# Patient Record
Sex: Female | Born: 1980 | Race: White | Hispanic: No | Marital: Married | Smoking: Never smoker
Health system: Southern US, Community
[De-identification: ages and names within clinical notes are randomized; demographics above are authoritative.]

## PROBLEM LIST (undated history)

## (undated) ENCOUNTER — Inpatient Hospital Stay (HOSPITAL_COMMUNITY): Payer: Self-pay

## (undated) DIAGNOSIS — F909 Attention-deficit hyperactivity disorder, unspecified type: Secondary | ICD-10-CM

## (undated) DIAGNOSIS — D649 Anemia, unspecified: Secondary | ICD-10-CM

## (undated) DIAGNOSIS — F3181 Bipolar II disorder: Secondary | ICD-10-CM

## (undated) HISTORY — PX: BREAST SURGERY: SHX581

## (undated) HISTORY — DX: Attention-deficit hyperactivity disorder, unspecified type: F90.9

## (undated) HISTORY — PX: APPENDECTOMY: SHX54

## (undated) HISTORY — DX: Bipolar II disorder: F31.81

## (undated) HISTORY — PX: WISDOM TOOTH EXTRACTION: SHX21

---

## 2016-06-17 ENCOUNTER — Telehealth: Payer: Self-pay | Admitting: Emergency Medicine

## 2016-06-17 NOTE — Telephone Encounter (Signed)
Called pt to complete pre-visit phone call. LMOVM to return call. Patient advised on message that if unable to return call to please arrive 10-15 minutes early for appointment to update all insurance and paperwork with front desk.

## 2016-06-18 ENCOUNTER — Ambulatory Visit (INDEPENDENT_AMBULATORY_CARE_PROVIDER_SITE_OTHER): Payer: BLUE CROSS/BLUE SHIELD | Admitting: Family Medicine

## 2016-06-18 ENCOUNTER — Encounter: Payer: Self-pay | Admitting: Family Medicine

## 2016-06-18 VITALS — BP 120/70 | HR 88 | Temp 97.7°F | Resp 16 | Ht 65.0 in | Wt 212.0 lb

## 2016-06-18 DIAGNOSIS — F3181 Bipolar II disorder: Secondary | ICD-10-CM | POA: Insufficient documentation

## 2016-06-18 DIAGNOSIS — F988 Other specified behavioral and emotional disorders with onset usually occurring in childhood and adolescence: Secondary | ICD-10-CM | POA: Insufficient documentation

## 2016-06-18 DIAGNOSIS — F909 Attention-deficit hyperactivity disorder, unspecified type: Secondary | ICD-10-CM | POA: Diagnosis not present

## 2016-06-18 DIAGNOSIS — Z01419 Encounter for gynecological examination (general) (routine) without abnormal findings: Secondary | ICD-10-CM

## 2016-06-18 DIAGNOSIS — E669 Obesity, unspecified: Secondary | ICD-10-CM | POA: Insufficient documentation

## 2016-06-18 DIAGNOSIS — Z23 Encounter for immunization: Secondary | ICD-10-CM | POA: Diagnosis not present

## 2016-06-18 MED ORDER — AMPHETAMINE-DEXTROAMPHET ER 10 MG PO CP24: ORAL_CAPSULE | ORAL | 0 refills | Status: DC

## 2016-06-18 NOTE — Progress Notes (Signed)
Pre visit review using our clinic review tool, if applicable. No additional management support is needed unless otherwise documented below in the visit note. 

## 2016-06-18 NOTE — Progress Notes (Signed)
   Subjective:    Patient ID: Andrea [name], female    DOB: 1981-02-06, 35 y.o.   MRN: [id number]  HPI New to establish.  Previous MD- Vidette Blas, [facility]  Needs local GYN  ADHD- chronic problem, pt has been on Adderall w/ good control of sxs.  Taking 10mg  tabs BID.  Hypomanic- pt has not had an episode in over 3 yrs.  Previously on Seroquel, Lamictal (had allergic rxn), Lithium.  Will take Ativan as needed for panicked moments.  Taking Ambien as needed for sleep- ~4x/week.  Previously worked with [facility] so she now sees Dr Gershon Mussel Sprague as needed for consults.  Obesity- pt has a nearly 34 yr old at home, gained 80 lbs w/ pregnancy.  Has so far lost 25 lbs.  Exercising twice weekly- trying to increase this.  Review of Systems For ROS see HPI     Objective:   Physical Exam  Constitutional: She is oriented to person, place, and time. She appears well-developed and well-nourished. No distress.  HENT:  Head: Normocephalic and atraumatic.  Eyes: Conjunctivae and EOM are normal. Pupils are equal, round, and reactive to light.  Neck: Normal range of motion. Neck supple. No thyromegaly present.  Cardiovascular: Normal rate, regular rhythm, normal heart sounds and intact distal pulses.   No murmur heard. Pulmonary/Chest: Effort normal and breath sounds normal. No respiratory distress.  Abdominal: Soft. She exhibits no distension. There is no tenderness.  Musculoskeletal: She exhibits no edema.  Lymphadenopathy:    She has no cervical adenopathy.  Neurological: She is alert and oriented to person, place, and time.  Skin: Skin is warm and dry.  Psychiatric: She has a normal mood and affect. Her behavior is normal.  Vitals reviewed.         Assessment & Plan:

## 2016-06-18 NOTE — Assessment & Plan Note (Signed)
New to provider, ongoing for pt.  Currently well controlled.  Has psych that she consults PRN.  Taking Ambien to help regulate sleep and Ativan prn.  Will continue to follow.

## 2016-06-18 NOTE — Patient Instructions (Signed)
Schedule your complete physical in 6 months We'll notify you of your lab results and make any changes if needed Keep up the good work on healthy diet and regular exercise- you can do it!!! Call with any questions or concerns Welcome!!  We're glad to have you!!! 

## 2016-06-18 NOTE — Assessment & Plan Note (Signed)
New to provider.  Pt has gained 80 lbs in last 2 yrs due to pregnancy.  Exercising twice weekly but is hoping to increase this.  Check labs to risk stratify.  Stressed need for healthy diet and regular exercise.  Will follow.

## 2016-06-18 NOTE — Assessment & Plan Note (Signed)
New to provider, ongoing for pt.  She has been well controlled on her Adderall XR 10mg  twice daily but insurance is giving her a hard time about this- recommending she take 20mg  once daily.  Pt's psych and previous MD had serious reservations about this given her hx of hypomania.  Prescription given and will complete any prior authorization that is required.  Will follow.

## 2016-06-25 ENCOUNTER — Other Ambulatory Visit: Payer: BLUE CROSS/BLUE SHIELD

## 2016-06-25 LAB — BASIC METABOLIC PANEL
BUN: 11 mg/dL (ref 6–23)
CALCIUM: 9 mg/dL (ref 8.4–10.5)
CO2: 24 mEq/L (ref 19–32)
Chloride: 104 mEq/L (ref 96–112)
Creatinine, Ser: 0.66 mg/dL (ref 0.40–1.20)
GFR: 108.73 mL/min (ref >60.00)
Glucose, Bld: 79 mg/dL (ref 70–99)
Potassium: 4.3 mEq/L (ref 3.5–5.1)
SODIUM: 136 meq/L (ref 135–145)

## 2016-06-25 LAB — HEPATIC FUNCTION PANEL
ALT: 20 U/L (ref 0–35)
AST: 14 U/L (ref 0–37)
Albumin: 3.9 g/dL (ref 3.5–5.2)
Alkaline Phosphatase: 60 U/L (ref 39–117)
BILIRUBIN DIRECT: 0.1 mg/dL (ref 0.0–0.3)
BILIRUBIN TOTAL: 0.4 mg/dL (ref 0.2–1.2)
TOTAL PROTEIN: 7 g/dL (ref 6.0–8.3)

## 2016-06-25 LAB — CBC WITH DIFFERENTIAL/PLATELET
BASOS PCT: 2.1 % (ref 0.0–3.0)
Basophils Absolute: 0.1 10*3/uL (ref 0.0–0.1)
EOS PCT: 1.3 % (ref 0.0–5.0)
Eosinophils Absolute: 0.1 10*3/uL (ref 0.0–0.7)
HEMATOCRIT: 36 % (ref 36.0–46.0)
HEMOGLOBIN: 12.2 g/dL (ref 12.0–15.0)
LYMPHS PCT: 50.1 % — AB (ref 12.0–46.0)
Lymphs Abs: 2.9 10*3/uL (ref 0.7–4.0)
MCHC: 33.8 g/dL (ref 30.0–36.0)
MCV: 90.5 fl (ref 78.0–100.0)
MONOS PCT: 4.1 % (ref 3.0–12.0)
Monocytes Absolute: 0.2 10*3/uL (ref 0.1–1.0)
NEUTROS ABS: 2.5 10*3/uL (ref 1.4–7.7)
Neutrophils Relative %: 42.4 % — ABNORMAL LOW (ref 43.0–77.0)
PLATELETS: 284 10*3/uL (ref 150.0–400.0)
RBC: 3.98 Mil/uL (ref 3.87–5.11)
RDW: 12.6 % (ref 11.5–15.5)
WBC: 5.8 10*3/uL (ref 4.0–10.5)

## 2016-06-25 LAB — LIPID PANEL
CHOL/HDL RATIO: 3
Cholesterol: 159 mg/dL (ref 0–200)
HDL: 57.7 mg/dL (ref >39.00)
LDL CALC: 70 mg/dL (ref 0–99)
NONHDL: 101.65
Triglycerides: 157 mg/dL — ABNORMAL HIGH (ref 0.0–149.0)
VLDL: 31.4 mg/dL (ref 0.0–40.0)

## 2016-06-25 LAB — TSH: TSH: 2.89 u[IU]/mL (ref 0.35–4.50)

## 2016-06-26 ENCOUNTER — Encounter: Payer: Self-pay | Admitting: General Practice

## 2016-09-18 ENCOUNTER — Telehealth: Payer: Self-pay | Admitting: *Deleted

## 2016-09-18 MED ORDER — AMPHETAMINE-DEXTROAMPHET ER 10 MG PO CP24: ORAL_CAPSULE | ORAL | 0 refills | Status: DC

## 2016-09-18 NOTE — Addendum Note (Signed)
Addended by: Davis Gourd on: 09/18/2016 02:56 PM   Modules accepted: Orders

## 2016-09-18 NOTE — Telephone Encounter (Signed)
Ok for #60, no refills 

## 2016-09-18 NOTE — Telephone Encounter (Signed)
Patient is in need of her ADDERALL XR 10 MG.   She has enough to get her to Friday, so she wanted  to see when she could come by and pick it up.

## 2016-09-18 NOTE — Telephone Encounter (Signed)
Medication filled and pt made aware available at the front desk for pick up.

## 2016-10-11 ENCOUNTER — Encounter (HOSPITAL_COMMUNITY): Payer: Self-pay | Admitting: *Deleted

## 2016-10-11 ENCOUNTER — Emergency Department (HOSPITAL_COMMUNITY)
Admission: EM | Admit: 2016-10-11 | Discharge: 2016-10-11 | Disposition: A | Discharge status: Home / Self Care | Payer: BLUE CROSS/BLUE SHIELD | Attending: Emergency Medicine | Admitting: Emergency Medicine

## 2016-10-11 DIAGNOSIS — Y999 Unspecified external cause status: Secondary | ICD-10-CM | POA: Insufficient documentation

## 2016-10-11 DIAGNOSIS — R10819 Abdominal tenderness, unspecified site: Secondary | ICD-10-CM | POA: Diagnosis not present

## 2016-10-11 DIAGNOSIS — Z79899 Other long term (current) drug therapy: Secondary | ICD-10-CM | POA: Diagnosis not present

## 2016-10-11 DIAGNOSIS — R42 Dizziness and giddiness: Secondary | ICD-10-CM | POA: Diagnosis not present

## 2016-10-11 DIAGNOSIS — H538 Other visual disturbances: Secondary | ICD-10-CM | POA: Diagnosis not present

## 2016-10-11 DIAGNOSIS — Y92410 Unspecified street and highway as the place of occurrence of the external cause: Secondary | ICD-10-CM | POA: Diagnosis not present

## 2016-10-11 DIAGNOSIS — R52 Pain, unspecified: Secondary | ICD-10-CM | POA: Diagnosis not present

## 2016-10-11 DIAGNOSIS — R110 Nausea: Secondary | ICD-10-CM | POA: Insufficient documentation

## 2016-10-11 DIAGNOSIS — Y939 Activity, unspecified: Secondary | ICD-10-CM | POA: Diagnosis not present

## 2016-10-11 DIAGNOSIS — F909 Attention-deficit hyperactivity disorder, unspecified type: Secondary | ICD-10-CM | POA: Insufficient documentation

## 2016-10-11 DIAGNOSIS — S3690XA Unspecified injury of unspecified intra-abdominal organ, initial encounter: Secondary | ICD-10-CM | POA: Diagnosis not present
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
## 2016-10-11 NOTE — Discharge Instructions (Signed)
Take Tylenol or Advil for aches. Return if concern for any reason or see Dr.Tabori. You will likely be sore for a few days.
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
## 2016-10-17 ENCOUNTER — Encounter: Payer: Self-pay | Admitting: Family Medicine

## 2016-10-17 ENCOUNTER — Ambulatory Visit (INDEPENDENT_AMBULATORY_CARE_PROVIDER_SITE_OTHER): Payer: BLUE CROSS/BLUE SHIELD | Admitting: Family Medicine

## 2016-10-17 DIAGNOSIS — M25511 Pain in right shoulder: Secondary | ICD-10-CM

## 2016-10-17 DIAGNOSIS — M542 Cervicalgia: Secondary | ICD-10-CM

## 2016-10-17 MED ORDER — METHOCARBAMOL 500 MG PO TABS: 500.0000 mg | ORAL_TABLET | Freq: Three times a day (TID) | ORAL | 1 refills | Status: DC

## 2016-10-17 MED ORDER — MELOXICAM 15 MG PO TABS: 15.0000 mg | ORAL_TABLET | Freq: Every day | ORAL | 1 refills | Status: DC

## 2016-10-17 NOTE — Patient Instructions (Signed)
Follow up as needed- particularly if not improving Start the Meloxicam once daily for inflammation.  Take w/ food.  (This is in place of ibuprofen/aleve) Use the Methocarbamol (muscle relaxer) as needed.  This is a lower dose and not as sedating as some others but can still cause drowsiness in some HEAT! Continue to do some gentle stretching Call with any questions or concerns Hang in there!!!

## 2016-10-17 NOTE — Progress Notes (Signed)
Pre visit review using our clinic review tool, if applicable. No additional management support is needed unless otherwise documented below in the visit note. 
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
## 2016-11-04 ENCOUNTER — Telehealth: Payer: Self-pay | Admitting: Family Medicine

## 2016-11-04 MED ORDER — AMPHETAMINE-DEXTROAMPHET ER 20 MG PO CP24: 20.0000 mg | ORAL_CAPSULE | Freq: Every day | ORAL | 0 refills | Status: DC

## 2016-11-04 NOTE — Telephone Encounter (Signed)
Pharmacy called to say that rx amphetamine-dextroamphetamine (ADDERALL XR) 10 MG 24 hr capsule insurance only paying for 20mg  once a day dosage.  Please change rx. Please call [facility] back to advise or change rx and send to [place].  Thank you

## 2016-11-04 NOTE — Telephone Encounter (Signed)
Discussed with pt, she would like to try the adderall 20mg  XR. Prescription printed and placed at the front desk .

## 2016-11-04 NOTE — Addendum Note (Signed)
Addended by: Davis Gourd on: 11/04/2016 03:55 PM   Modules accepted: Orders

## 2016-11-04 NOTE — Telephone Encounter (Signed)
Please advise 

## 2016-11-04 NOTE — Telephone Encounter (Signed)
Please discuss this w/ pt as this is different than she has been taking.  I am happy to fill the XR 20mg  once daily if she's ok w/ the change

## 2017-01-09 ENCOUNTER — Encounter: Payer: Self-pay | Admitting: Family Medicine

## 2017-01-09 ENCOUNTER — Ambulatory Visit (INDEPENDENT_AMBULATORY_CARE_PROVIDER_SITE_OTHER): Payer: BLUE CROSS/BLUE SHIELD | Admitting: Family Medicine

## 2017-01-09 VITALS — BP 116/80 | HR 78 | Resp 16 | Ht 65.0 in | Wt 223.5 lb

## 2017-01-09 DIAGNOSIS — M546 Pain in thoracic spine: Secondary | ICD-10-CM | POA: Diagnosis not present

## 2017-01-09 NOTE — Progress Notes (Signed)
   Subjective:    Patient ID: Andrea [name], female    DOB: [birth year], 36 y.o.   MRN: [id number]  HPI Back/hip pain- pt reports with certain motions-particularly yoga poses- she will develop a 'sharp pain- like a cinch- and then it gets prickly before it goes away'.  Sxs will last <2 minutes.  Pt reports sxs will occur intermittently and not in the same place each time.  Was in [place] Jan 26.  Pain is R sided- occurring from bra line into R hip.   Review of Systems For ROS see HPI     Objective:   Physical Exam  Constitutional: She is oriented to person, place, and time. She appears well-developed and well-nourished. No distress.  obese  Cardiovascular: Intact distal pulses.   Musculoskeletal: She exhibits no edema or tenderness (no TTP over spine or R paraspinal muscle or over R glute).  Full forward flexion/extension of back Some pain w/ rotation of back  Neurological: She is alert and oriented to person, place, and time.  Skin: Skin is warm and dry. No rash noted. No erythema.  Psychiatric: She has a normal mood and affect. Her behavior is normal. Thought content normal.  Vitals reviewed.         Assessment & Plan:  R sided back pain- pt will have intermittent, fleeting pain on R side of back between bra line and glutes.  Suspect this is nerve irritation as it only occurs w/ certain movements.  No need for anti-inflammatories as pain is sporadic, short lived, and self limited.  Refer to PT for possible stretching or strengthening exercises.  Reviewed supportive care and red flags that should prompt return.  Pt expressed understanding and is in agreement w/ plan.

## 2017-01-09 NOTE — Progress Notes (Signed)
Pre visit review using our clinic review tool, if applicable. No additional management support is needed unless otherwise documented below in the visit note. 

## 2017-01-09 NOTE — Patient Instructions (Signed)
Follow up as needed/scheduled What you're describing is consistent w/ nerve irritation Continue to do your yoga, stretching and massage- this is great! We'll call you with your PT appt to help w/ pain Call with any questions or concerns Hang in there!!!

## 2017-01-20 ENCOUNTER — Ambulatory Visit: Payer: BLUE CROSS/BLUE SHIELD | Attending: Family Medicine | Admitting: Physical Therapy

## 2017-01-20 ENCOUNTER — Encounter: Payer: Self-pay | Admitting: Physical Therapy

## 2017-01-20 DIAGNOSIS — M546 Pain in thoracic spine: Secondary | ICD-10-CM | POA: Diagnosis not present

## 2017-01-20 DIAGNOSIS — R208 Other disturbances of skin sensation: Secondary | ICD-10-CM
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
## 2017-01-27 ENCOUNTER — Ambulatory Visit: Payer: BLUE CROSS/BLUE SHIELD | Admitting: Physical Therapy

## 2017-01-27 DIAGNOSIS — M546 Pain in thoracic spine: Secondary | ICD-10-CM

## 2017-01-27 DIAGNOSIS — R208 Other disturbances of skin sensation: Secondary | ICD-10-CM

## 2017-01-27 NOTE — Patient Instructions (Signed)
Use your foam roller length ways for stability exercises  FLY (red band) x 10   Alternating arms overhead  Use roller horizontally to stretch back into extension, support your neck and head.    Resisted Horizontal Abduction: Bilateral    Sit or stand, tubing in both hands, arms out in front. Keeping arms straight, pinch shoulder blades together and stretch arms out. Repeat ___10-20_ times per set. Do __2__ sets per session. Do __1-2__ sessions per day.  http://orth.exer.us/969   Copyright  VHI. All rights reserved.

## 2017-01-27 NOTE — Therapy (Addendum)
[place], [place], [address] Phone: [phone]   Fax:  [phone]  Physical Therapy Treatment and Discharge  Patient Details  Name: Andrea [name] MRN: [id number] Date of Birth: Apr 26, 1981 Referring Provider: Dr. Annye Asa  Encounter Date: 01/27/2017      PT End of Session - 01/27/17 1534    Visit Number 2   Number of Visits 12   Date for PT Re-Evaluation 03/03/17   PT Start Time 7622   PT Stop Time 1429   PT Time Calculation (min) 56 min   Activity Tolerance Patient tolerated treatment well   Behavior During Therapy [facility] for tasks assessed/performed      Past Medical History:  Diagnosis Date  . ADHD   . Hypomanic bipolar II disorder [facility])     Past Surgical History:  Procedure Laterality Date  . CESAREAN SECTION  10/2014    There were no vitals filed for this visit.      Subjective Assessment - 01/27/17 1334    Subjective Had a massage.  Noticed her symptoms were reproduced when doing quadruped (bird dog) but could do either UE OR LE but not both.    Currently in Pain? No/denies              [facility] Adult PT Treatment/Exercise - 01/27/17 1346      Lumbar Exercises: Aerobic   Stationary Bike NuStep LE and UE for 5 min for flexibilty      Lumbar Exercises: Supine   Other Supine Lumbar Exercises thoracic ext over foam roller    Other Supine Lumbar Exercises foam stability ex: retract/protract, alternating arms overhead  and horizontal abduction      Lumbar Exercises: Sidelying   Other Sidelying Lumbar Exercises upper trunk rotation x 6 each side, breathing focus    Other Sidelying Lumbar Exercises Rt. trunk stretch and rotation      Lumbar Exercises: Quadruped   Madcat/Old Horse 5 reps   Single Arm Raise 5 reps   Straight Leg Raise 5 reps   Opposite Arm/Leg Raise 5 reps   Opposite Arm/Leg Raise Limitations able to do without pain after manual    Other Quadruped Lumbar  Exercises childs pose, forward and lateral, wags for lateral flexion       Manual Therapy   Manual Therapy Joint mobilization   Joint Mobilization Gr I-II rotational mobs to Rt. thoracic (mid)                PT Education - 01/27/17 1352    Education provided Yes   Education Details HEP, reinforcement, stretching    Person(s) Educated Patient   Methods Explanation;Verbal cues;Demonstration   Comprehension Verbalized understanding;Returned demonstration             PT Long Term Goals - 01/20/17 1530      PT LONG TERM GOAL #1   Title Pt will be I with HEP for core stability and trunk flexibility.    Time 6   Period Weeks   Status New     PT LONG TERM GOAL #2   Title Pt will be I with lifting, posture and body mechanics related to child care.    Time 6   Period Weeks   Status New     PT LONG TERM GOAL #3   Title Pt will be able to modify gym routine to preserve back and report no pain with normal routine.    Time 6   Period Weeks  Status New     PT LONG TERM GOAL #4   Title FOTO score will improve to less than 25% impaired to demo improved.     Time 6   Period Weeks   Status New     PT LONG TERM GOAL #5   Title Pt will be able to help son into the car with no pain increase >75% of the time.    Time 6   Period Weeks   Status New               Plan - 01/27/17 1535    Clinical Impression Statement Worked on thoracic mobility today.  After manual she was able to do bird dog exercise without pain. Pt has a foam roller and she plans to use it at home with the exercises we did today. No pain post.    PT Next Visit Plan manual to T spine, progress core, try Tr PDN and Pilates    PT Home Exercise Plan quadruped progression , thoracic mobility    Consulted and Agree with Plan of Care Patient      Patient will benefit from skilled therapeutic intervention in order to improve the following deficits and impairments:  Decreased balance, Impaired  sensation, Postural dysfunction, Impaired flexibility, Improper body mechanics, Pain, Obesity, Increased fascial restricitons, Decreased range of motion  Visit Diagnosis: Pain in thoracic spine  Other disturbances of skin sensation     Problem List Patient Active Problem List   Diagnosis Date Noted  . ADD (attention deficit disorder) 06/18/2016  . Obesity (BMI 30-39.9) 06/18/2016  . Hypomanic bipolar II disorder [facility])     Andrea [name] 01/27/2017, 3:41 PM  [facility] [address] [place], [place], [address] Phone: [phone]   Fax:  [phone]  Name: Andrea [name] MRN: [id number] Date of Birth: February 16, 1981  Raeford Razor, PT 01/27/17 3:41 PM Phone: [phone] Fax: [phone]   PHYSICAL THERAPY DISCHARGE SUMMARY  Visits from Start of Care: 2  Current functional level related to goals / functional outcomes: See above for most recent info    Remaining deficits: See above   Education / Equipment: HEP, posture and core  Plan: Patient agrees to discharge.  Patient goals were not met. Patient is being discharged due to not returning since the last visit.  ?????     Raeford Razor, PT 03/11/17 1:30 PM Phone: [phone] Fax: [phone]

## 2017-01-30 ENCOUNTER — Ambulatory Visit: Payer: BLUE CROSS/BLUE SHIELD | Admitting: Physical Therapy

## 2017-02-03 ENCOUNTER — Ambulatory Visit: Payer: BLUE CROSS/BLUE SHIELD | Admitting: Physical Therapy

## 2017-04-01 ENCOUNTER — Other Ambulatory Visit: Payer: Self-pay | Admitting: Family Medicine

## 2017-04-02 MED ORDER — AMPHETAMINE-DEXTROAMPHET ER 20 MG PO CP24: 20.0000 mg | ORAL_CAPSULE | Freq: Every day | ORAL | 0 refills | Status: DC

## 2017-04-24 ENCOUNTER — Encounter: Payer: Self-pay | Admitting: Family Medicine

## 2017-04-24 ENCOUNTER — Ambulatory Visit (INDEPENDENT_AMBULATORY_CARE_PROVIDER_SITE_OTHER): Payer: BLUE CROSS/BLUE SHIELD | Admitting: Family Medicine

## 2017-04-24 VITALS — BP 122/82 | HR 73 | Temp 98.3°F | Resp 16 | Ht 65.0 in | Wt 234.2 lb

## 2017-04-24 DIAGNOSIS — Z01419 Encounter for gynecological examination (general) (routine) without abnormal findings: Secondary | ICD-10-CM

## 2017-04-24 DIAGNOSIS — Z23 Encounter for immunization: Secondary | ICD-10-CM | POA: Diagnosis not present

## 2017-04-24 DIAGNOSIS — Z0000 Encounter for general adult medical examination without abnormal findings: Secondary | ICD-10-CM | POA: Diagnosis not present

## 2017-04-24 NOTE — Progress Notes (Signed)
   Subjective:    Patient ID: Andrea [name], female    DOB: [birth year], 36 y.o.   MRN: [id number]  HPI CPE- UTD on pap.  Due for Tdap.     Review of Systems Patient reports no vision/ hearing changes, adenopathy,fever, weight change,  persistant/recurrent hoarseness , swallowing issues, chest pain, palpitations, edema, persistant/recurrent cough, hemoptysis, dyspnea (rest/exertional/paroxysmal nocturnal), gastrointestinal bleeding (melena, rectal bleeding), abdominal pain, significant heartburn, bowel changes, GU symptoms (dysuria, hematuria, incontinence), Gyn symptoms (abnormal  bleeding, pain),  syncope, focal weakness, memory loss, numbness & tingling, skin/hair/nail changes, abnormal bruising or bleeding, anxiety, or depression.     Objective:   Physical Exam General Appearance:    Alert, cooperative, no distress, appears stated age  Head:    Normocephalic, without obvious abnormality, atraumatic  Eyes:    PERRL, conjunctiva/corneas clear, EOM's intact, fundi    benign, both eyes  Ears:    Normal TM's and external ear canals, both ears  Nose:   Nares normal, septum midline, mucosa normal, no drainage    or sinus tenderness  Throat:   Lips, mucosa, and tongue normal; teeth and gums normal  Neck:   Supple, symmetrical, trachea midline, no adenopathy;    Thyroid: no enlargement/tenderness/nodules  Back:     Symmetric, no curvature, ROM normal, no CVA tenderness  Lungs:     Clear to auscultation bilaterally, respirations unlabored  Chest Wall:    No tenderness or deformity   Heart:    Regular rate and rhythm, S1 and S2 normal, no murmur, rub   or gallop  Breast Exam:    Deferred to GYN  Abdomen:     Soft, non-tender, bowel sounds active all four quadrants,    no masses, no organomegaly  Genitalia:    Deferred to GYN  Rectal:    Extremities:   Extremities normal, atraumatic, no cyanosis or edema  Pulses:   2+ and symmetric all extremities  Skin:   Skin color, texture, turgor normal,  no rashes or lesions  Lymph nodes:   Cervical, supraclavicular, and axillary nodes normal  Neurologic:   CNII-XII intact, normal strength, sensation and reflexes    throughout          Assessment & Plan:

## 2017-04-24 NOTE — Patient Instructions (Signed)
Follow up in 1 year or as needed We'll notify you of your lab results and make any changes if needed Continue to work on healthy diet and regular exercise- you can do it! We'll call you with your GYN appt Call with any questions or concerns ENJOY YOUR TRIP!!!

## 2017-04-24 NOTE — Progress Notes (Signed)
Pre visit review using our clinic review tool, if applicable. No additional management support is needed unless otherwise documented below in the visit note. 

## 2017-04-24 NOTE — Assessment & Plan Note (Signed)
Pt's PE WNL w/ exception of obesity.  Refer to GYN at pt's request.  Check labs.  Anticipatory guidance provided.

## 2017-04-24 NOTE — Addendum Note (Signed)
Addended by: Davis Gourd on: 04/24/2017 09:55 AM   Modules accepted: Orders

## 2017-05-01 ENCOUNTER — Other Ambulatory Visit: Payer: BLUE CROSS/BLUE SHIELD

## 2017-05-02 ENCOUNTER — Other Ambulatory Visit: Payer: BLUE CROSS/BLUE SHIELD

## 2017-05-23 DIAGNOSIS — Z01419 Encounter for gynecological examination (general) (routine) without abnormal findings: Secondary | ICD-10-CM | POA: Diagnosis not present

## 2017-05-23 DIAGNOSIS — Z1151 Encounter for screening for human papillomavirus (HPV): Secondary | ICD-10-CM | POA: Diagnosis not present

## 2017-05-23 DIAGNOSIS — Z6841 Body Mass Index (BMI) 40.0 and over, adult: Secondary | ICD-10-CM | POA: Diagnosis not present

## 2017-06-02 DIAGNOSIS — Z6841 Body Mass Index (BMI) 40.0 and over, adult: Secondary | ICD-10-CM | POA: Diagnosis not present

## 2017-06-02 DIAGNOSIS — Z3009 Encounter for other general counseling and advice on contraception: Secondary | ICD-10-CM | POA: Diagnosis not present

## 2017-07-15 ENCOUNTER — Observation Stay (HOSPITAL_BASED_OUTPATIENT_CLINIC_OR_DEPARTMENT_OTHER)
Admission: EM | Admit: 2017-07-15 | Discharge: 2017-07-16 | Disposition: A | Discharge status: Home / Self Care | Payer: BLUE CROSS/BLUE SHIELD | Attending: General Surgery | Admitting: General Surgery

## 2017-07-15 ENCOUNTER — Encounter (HOSPITAL_COMMUNITY)
Admission: EM | Disposition: A | Discharge status: Home / Self Care | Payer: Self-pay | Source: Home / Self Care | Attending: Emergency Medicine

## 2017-07-15 ENCOUNTER — Encounter (HOSPITAL_BASED_OUTPATIENT_CLINIC_OR_DEPARTMENT_OTHER): Payer: Self-pay | Admitting: *Deleted

## 2017-07-15 ENCOUNTER — Ambulatory Visit (HOSPITAL_COMMUNITY): Payer: BLUE CROSS/BLUE SHIELD | Admitting: Anesthesiology

## 2017-07-15 ENCOUNTER — Emergency Department (HOSPITAL_BASED_OUTPATIENT_CLINIC_OR_DEPARTMENT_OTHER): Payer: BLUE CROSS/BLUE SHIELD

## 2017-07-15 DIAGNOSIS — F909 Attention-deficit hyperactivity disorder, unspecified type: Secondary | ICD-10-CM | POA: Insufficient documentation

## 2017-07-15 DIAGNOSIS — Z79899 Other long term (current) drug therapy: Secondary | ICD-10-CM | POA: Insufficient documentation

## 2017-07-15 DIAGNOSIS — F319 Bipolar disorder, unspecified: Secondary | ICD-10-CM | POA: Diagnosis not present

## 2017-07-15 DIAGNOSIS — E669 Obesity, unspecified: Secondary | ICD-10-CM | POA: Diagnosis not present

## 2017-07-15 DIAGNOSIS — K3580 Unspecified acute appendicitis: Secondary | ICD-10-CM | POA: Diagnosis not present

## 2017-07-15 DIAGNOSIS — Z6838 Body mass index (BMI) 38.0-38.9, adult: Secondary | ICD-10-CM | POA: Insufficient documentation

## 2017-07-15 DIAGNOSIS — F3181 Bipolar II disorder: Secondary | ICD-10-CM | POA: Diagnosis not present

## 2017-07-15 HISTORY — PX: LAPAROSCOPIC APPENDECTOMY: SHX408

## 2017-07-15 LAB — CBC
HCT: 35.4 % — ABNORMAL LOW (ref 36.0–46.0)
HEMOGLOBIN: 11.8 g/dL — AB (ref 12.0–15.0)
MCH: 30.3 pg (ref 26.0–34.0)
MCHC: 33.3 g/dL (ref 30.0–36.0)
MCV: 90.8 fL (ref 78.0–100.0)
PLATELETS: 274 10*3/uL (ref 150–400)
RBC: 3.9 MIL/uL (ref 3.87–5.11)
RDW: 12 % (ref 11.5–15.5)
WBC: 10.9 10*3/uL — AB (ref 4.0–10.5)

## 2017-07-15 LAB — I-STAT CHEM 8, ED
BUN: 7 mg/dL (ref 6–20)
Calcium, Ion: 0.94 mmol/L — ABNORMAL LOW (ref 1.15–1.40)
Chloride: 111 mmol/L (ref 101–111)
Creatinine, Ser: 0.5 mg/dL (ref 0.44–1.00)
Glucose, Bld: 96 mg/dL (ref 65–99)
HEMATOCRIT: 39 % (ref 36.0–46.0)
HEMOGLOBIN: 13.3 g/dL (ref 12.0–15.0)
POTASSIUM: 4.3 mmol/L (ref 3.5–5.1)
Sodium: 137 mmol/L (ref 135–145)
TCO2: 19 mmol/L — ABNORMAL LOW (ref 22–32)

## 2017-07-15 LAB — COMPREHENSIVE METABOLIC PANEL
ALBUMIN: 4.1 g/dL (ref 3.5–5.0)
ALT: 20 U/L (ref 14–54)
AST: 13 U/L — AB (ref 15–41)
Alkaline Phosphatase: 63 U/L (ref 38–126)
Anion gap: 9 (ref 5–15)
BUN: 6 mg/dL (ref 6–20)
CHLORIDE: 107 mmol/L (ref 101–111)
CO2: 20 mmol/L — AB (ref 22–32)
CREATININE: 0.57 mg/dL (ref 0.44–1.00)
Calcium: 8.9 mg/dL (ref 8.9–10.3)
GFR calc Af Amer: >60 mL/min (ref >60)
GFR calc non Af Amer: >60 mL/min (ref >60)
Glucose, Bld: 102 mg/dL — ABNORMAL HIGH (ref 65–99)
Potassium: 3.6 mmol/L (ref 3.5–5.1)
SODIUM: 136 mmol/L (ref 135–145)
Total Bilirubin: 0.6 mg/dL (ref 0.3–1.2)
Total Protein: 6.8 g/dL (ref 6.5–8.1)

## 2017-07-15 LAB — URINALYSIS, ROUTINE W REFLEX MICROSCOPIC
Bilirubin Urine: NEGATIVE
Glucose, UA: NEGATIVE mg/dL
Hgb urine dipstick: NEGATIVE
Ketones, ur: NEGATIVE mg/dL
LEUKOCYTES UA: NEGATIVE
Nitrite: NEGATIVE
PROTEIN: NEGATIVE mg/dL
Specific Gravity, Urine: 1.01 (ref 1.005–1.030)
pH: 6 (ref 5.0–8.0)

## 2017-07-15 LAB — LIPASE, BLOOD: Lipase: 26 U/L (ref 11–51)

## 2017-07-15 LAB — PREGNANCY, URINE: PREG TEST UR: NEGATIVE

## 2017-07-15 SURGERY — APPENDECTOMY, LAPAROSCOPIC
Anesthesia: General | Site: Abdomen

## 2017-07-15 MED ORDER — ROCURONIUM BROMIDE 10 MG/ML (PF) SYRINGE
PREFILLED_SYRINGE | INTRAVENOUS | Status: DC | PRN
  Administered 2017-07-15: 40 mg via INTRAVENOUS

## 2017-07-15 MED ORDER — PROPOFOL 10 MG/ML IV BOLUS
INTRAVENOUS | Status: DC | PRN
  Administered 2017-07-15: 200 mg via INTRAVENOUS

## 2017-07-15 MED ORDER — ONDANSETRON HCL 4 MG/2ML IJ SOLN: 4.0000 mg | INTRAMUSCULAR | Status: DC | PRN

## 2017-07-15 MED ORDER — DEXAMETHASONE SODIUM PHOSPHATE 10 MG/ML IJ SOLN
INTRAMUSCULAR | Status: AC
Start: 2017-07-15 — End: 2017-07-15
  Filled 2017-07-15: qty 1

## 2017-07-15 MED ORDER — ROCURONIUM BROMIDE 50 MG/5ML IV SOSY
PREFILLED_SYRINGE | INTRAVENOUS | Status: AC
  Filled 2017-07-15: qty 5

## 2017-07-15 MED ORDER — PROPOFOL 10 MG/ML IV BOLUS
INTRAVENOUS | Status: AC
  Filled 2017-07-15: qty 20

## 2017-07-15 MED ORDER — LIDOCAINE 2% (20 MG/ML) 5 ML SYRINGE
INTRAMUSCULAR | Status: AC
  Filled 2017-07-15: qty 5

## 2017-07-15 MED ORDER — FENTANYL CITRATE (PF) 250 MCG/5ML IJ SOLN
INTRAMUSCULAR | Status: DC | PRN
  Administered 2017-07-15: 25 ug via INTRAVENOUS
  Administered 2017-07-15: 50 ug via INTRAVENOUS

## 2017-07-15 MED ORDER — MEPERIDINE HCL 50 MG/ML IJ SOLN: 6.2500 mg | INTRAMUSCULAR | Status: DC | PRN

## 2017-07-15 MED ORDER — SUCCINYLCHOLINE CHLORIDE 200 MG/10ML IV SOSY
PREFILLED_SYRINGE | INTRAVENOUS | Status: AC
  Filled 2017-07-15: qty 10

## 2017-07-15 MED ORDER — HYDROMORPHONE HCL 1 MG/ML IJ SOLN
0.5000 mg | Freq: Once | INTRAMUSCULAR | Status: AC
  Administered 2017-07-15: 0.5 mg via INTRAVENOUS
  Filled 2017-07-15: qty 1

## 2017-07-15 MED ORDER — LACTATED RINGERS IV SOLN
INTRAVENOUS | Status: DC
  Administered 2017-07-15: 15:00:00 via INTRAVENOUS

## 2017-07-15 MED ORDER — ONDANSETRON HCL 4 MG/2ML IJ SOLN
4.0000 mg | Freq: Once | INTRAMUSCULAR | Status: AC
  Administered 2017-07-15: 4 mg via INTRAVENOUS
  Filled 2017-07-15: qty 2

## 2017-07-15 MED ORDER — 0.9 % SODIUM CHLORIDE (POUR BTL) OPTIME
TOPICAL | Status: DC | PRN
  Administered 2017-07-15: 1000 mL

## 2017-07-15 MED ORDER — HYDROCODONE-ACETAMINOPHEN 5-325 MG PO TABS
1.0000 | ORAL_TABLET | ORAL | Status: DC | PRN
  Administered 2017-07-16 (×2): 2 via ORAL
  Administered 2017-07-16: 1 via ORAL
  Filled 2017-07-15 (×2): qty 2
  Filled 2017-07-15: qty 1

## 2017-07-15 MED ORDER — ONDANSETRON HCL 4 MG/2ML IJ SOLN
INTRAMUSCULAR | Status: DC | PRN
  Administered 2017-07-15: 4 mg via INTRAVENOUS

## 2017-07-15 MED ORDER — LIDOCAINE 2% (20 MG/ML) 5 ML SYRINGE
INTRAMUSCULAR | Status: DC | PRN
  Administered 2017-07-15: 100 mg via INTRAVENOUS

## 2017-07-15 MED ORDER — HYDROMORPHONE HCL 1 MG/ML IJ SOLN
INTRAMUSCULAR | Status: AC
  Administered 2017-07-15: 0.5 mg via INTRAVENOUS
  Filled 2017-07-15: qty 2

## 2017-07-15 MED ORDER — MORPHINE SULFATE (PF) 4 MG/ML IV SOLN
2.0000 mg | INTRAVENOUS | Status: DC | PRN
  Administered 2017-07-15 – 2017-07-16 (×5): 4 mg via INTRAVENOUS
  Filled 2017-07-15 (×4): qty 1

## 2017-07-15 MED ORDER — BUPIVACAINE HCL (PF) 0.5 % IJ SOLN
INTRAMUSCULAR | Status: AC
  Filled 2017-07-15: qty 30

## 2017-07-15 MED ORDER — ENOXAPARIN SODIUM 40 MG/0.4ML ~~LOC~~ SOLN: 40.0000 mg | SUBCUTANEOUS | Status: DC

## 2017-07-15 MED ORDER — HYDROMORPHONE HCL 1 MG/ML IJ SOLN
0.2500 mg | INTRAMUSCULAR | Status: DC | PRN
  Administered 2017-07-15 (×3): 0.5 mg via INTRAVENOUS

## 2017-07-15 MED ORDER — PROMETHAZINE HCL 25 MG/ML IJ SOLN: 6.2500 mg | INTRAMUSCULAR | Status: DC | PRN

## 2017-07-15 MED ORDER — ONDANSETRON HCL 4 MG/2ML IJ SOLN
INTRAMUSCULAR | Status: AC
  Filled 2017-07-15: qty 2

## 2017-07-15 MED ORDER — SCOPOLAMINE 1 MG/3DAYS TD PT72
MEDICATED_PATCH | TRANSDERMAL | Status: AC
  Filled 2017-07-15: qty 1

## 2017-07-15 MED ORDER — ONDANSETRON 4 MG PO TBDP: 4.0000 mg | ORAL_TABLET | Freq: Four times a day (QID) | ORAL | Status: DC | PRN

## 2017-07-15 MED ORDER — SUGAMMADEX SODIUM 200 MG/2ML IV SOLN
INTRAVENOUS | Status: DC | PRN
  Administered 2017-07-15: 225 mg via INTRAVENOUS

## 2017-07-15 MED ORDER — FENTANYL CITRATE (PF) 100 MCG/2ML IJ SOLN
50.0000 ug | INTRAMUSCULAR | Status: DC | PRN
  Administered 2017-07-15: 50 ug via INTRAVENOUS
  Filled 2017-07-15: qty 2

## 2017-07-15 MED ORDER — KCL IN DEXTROSE-NACL 20-5-0.9 MEQ/L-%-% IV SOLN
INTRAVENOUS | Status: DC
  Administered 2017-07-15: 22:00:00 via INTRAVENOUS
  Filled 2017-07-15 (×2): qty 1000

## 2017-07-15 MED ORDER — DEXTROSE 5 % IV SOLN
2.0000 g | Freq: Once | INTRAVENOUS | Status: AC
  Administered 2017-07-15: 2 g via INTRAVENOUS
  Filled 2017-07-15: qty 2

## 2017-07-15 MED ORDER — METRONIDAZOLE IN NACL 5-0.79 MG/ML-% IV SOLN
500.0000 mg | Freq: Once | INTRAVENOUS | Status: AC
  Administered 2017-07-15: 500 mg via INTRAVENOUS
  Filled 2017-07-15: qty 100

## 2017-07-15 MED ORDER — IOPAMIDOL (ISOVUE-300) INJECTION 61%
100.0000 mL | Freq: Once | INTRAVENOUS | Status: AC | PRN
  Administered 2017-07-15: 100 mL via INTRAVENOUS

## 2017-07-15 MED ORDER — LACTATED RINGERS IV SOLN: INTRAVENOUS | Status: DC

## 2017-07-15 MED ORDER — BUPIVACAINE HCL (PF) 0.5 % IJ SOLN
INTRAMUSCULAR | Status: DC | PRN
  Administered 2017-07-15: 19 mL

## 2017-07-15 MED ORDER — LACTATED RINGERS IR SOLN
Status: DC | PRN
  Administered 2017-07-15: 2000 mL

## 2017-07-15 MED ORDER — SCOPOLAMINE 1 MG/3DAYS TD PT72
MEDICATED_PATCH | TRANSDERMAL | Status: DC | PRN
  Administered 2017-07-15: 1 via TRANSDERMAL

## 2017-07-15 MED ORDER — FENTANYL CITRATE (PF) 250 MCG/5ML IJ SOLN
INTRAMUSCULAR | Status: AC
  Filled 2017-07-15: qty 5

## 2017-07-15 MED ORDER — MIDAZOLAM HCL 2 MG/2ML IJ SOLN
INTRAMUSCULAR | Status: AC
  Filled 2017-07-15: qty 2

## 2017-07-15 MED ORDER — DEXAMETHASONE SODIUM PHOSPHATE 10 MG/ML IJ SOLN
INTRAMUSCULAR | Status: DC | PRN
  Administered 2017-07-15: 10 mg via INTRAVENOUS

## 2017-07-15 MED ORDER — SUCCINYLCHOLINE CHLORIDE 200 MG/10ML IV SOSY
PREFILLED_SYRINGE | INTRAVENOUS | Status: DC | PRN
  Administered 2017-07-15: 120 mg via INTRAVENOUS

## 2017-07-15 MED ORDER — MORPHINE SULFATE (PF) 4 MG/ML IV SOLN
INTRAVENOUS | Status: AC
  Administered 2017-07-15: 2 mg
  Filled 2017-07-15: qty 1

## 2017-07-15 MED ORDER — MIDAZOLAM HCL 5 MG/5ML IJ SOLN
INTRAMUSCULAR | Status: DC | PRN
  Administered 2017-07-15: 2 mg via INTRAVENOUS

## 2017-07-15 MED ORDER — SUGAMMADEX SODIUM 500 MG/5ML IV SOLN
INTRAVENOUS | Status: AC
  Filled 2017-07-15: qty 5

## 2017-07-15 SURGICAL SUPPLY — 42 items
APPLIER CLIP 5 13 M/L LIGAMAX5 (MISCELLANEOUS)
APPLIER CLIP ROT 10 11.4 M/L (STAPLE)
BENZOIN TINCTURE PRP APPL 2/3 (GAUZE/BANDAGES/DRESSINGS) ×2 IMPLANT
CABLE HIGH FREQUENCY MONO STRZ (ELECTRODE) ×2 IMPLANT
CHLORAPREP W/TINT 26ML (MISCELLANEOUS) ×2 IMPLANT
CLIP APPLIE 5 13 M/L LIGAMAX5 (MISCELLANEOUS) IMPLANT
CLIP APPLIE ROT 10 11.4 M/L (STAPLE) IMPLANT
CUTTER FLEX LINEAR 45M (STAPLE) ×2 IMPLANT
DECANTER SPIKE VIAL GLASS SM (MISCELLANEOUS) ×2 IMPLANT
DRAIN CHANNEL 19F RND (DRAIN) IMPLANT
DRAPE LAPAROSCOPIC ABDOMINAL (DRAPES) IMPLANT
DRSG TEGADERM 2-3/8X2-3/4 SM (GAUZE/BANDAGES/DRESSINGS) ×2 IMPLANT
ELECT REM PT RETURN 15FT ADLT (MISCELLANEOUS) ×2 IMPLANT
ENDOLOOP SUT PDS II  0 18 (SUTURE)
ENDOLOOP SUT PDS II 0 18 (SUTURE) IMPLANT
EVACUATOR SILICONE 100CC (DRAIN) IMPLANT
GAUZE SPONGE 2X2 8PLY STRL LF (GAUZE/BANDAGES/DRESSINGS) ×1 IMPLANT
GLOVE ECLIPSE 8.0 STRL XLNG CF (GLOVE) ×2 IMPLANT
GLOVE INDICATOR 8.0 STRL GRN (GLOVE) ×2 IMPLANT
GOWN STRL REUS W/TWL XL LVL3 (GOWN DISPOSABLE) ×6 IMPLANT
KIT BASIN OR (CUSTOM PROCEDURE TRAY) ×2 IMPLANT
POUCH RETRIEVAL ECOSAC 10 (ENDOMECHANICALS) ×1 IMPLANT
POUCH RETRIEVAL ECOSAC 10MM (ENDOMECHANICALS) ×1
POUCH SPECIMEN RETRIEVAL 10MM (ENDOMECHANICALS) IMPLANT
RELOAD 45 VASCULAR/THIN (ENDOMECHANICALS) IMPLANT
RELOAD STAPLE TA45 3.5 REG BLU (ENDOMECHANICALS) ×2 IMPLANT
SCISSORS LAP 5X35 DISP (ENDOMECHANICALS) ×2 IMPLANT
SET IRRIG TUBING LAPAROSCOPIC (IRRIGATION / IRRIGATOR) ×2 IMPLANT
SHEARS HARMONIC ACE PLUS 36CM (ENDOMECHANICALS) ×2 IMPLANT
SLEEVE XCEL OPT CAN 5 100 (ENDOMECHANICALS) ×2 IMPLANT
SPONGE GAUZE 2X2 STER 10/PKG (GAUZE/BANDAGES/DRESSINGS) ×1
STRIP CLOSURE SKIN 1/2X4 (GAUZE/BANDAGES/DRESSINGS) ×2 IMPLANT
SUT ETHILON 3 0 PS 1 (SUTURE) IMPLANT
SUT MNCRL AB 4-0 PS2 18 (SUTURE) ×2 IMPLANT
TOWEL OR 17X26 10 PK STRL BLUE (TOWEL DISPOSABLE) ×2 IMPLANT
TOWEL OR NON WOVEN STRL DISP B (DISPOSABLE) ×2 IMPLANT
TRAY FOLEY W/METER SILVER 14FR (SET/KITS/TRAYS/PACK) ×2 IMPLANT
TRAY FOLEY W/METER SILVER 16FR (SET/KITS/TRAYS/PACK) IMPLANT
TRAY LAPAROSCOPIC (CUSTOM PROCEDURE TRAY) ×2 IMPLANT
TROCAR BLADELESS OPT 5 100 (ENDOMECHANICALS) ×2 IMPLANT
TROCAR XCEL BLUNT TIP 100MML (ENDOMECHANICALS) ×2 IMPLANT
TUBING INSUF HEATED (TUBING) ×2 IMPLANT

## 2017-07-15 NOTE — Anesthesia Postprocedure Evaluation (Signed)
Anesthesia Post Note  Patient: Andrea [name]  Procedure(s) Performed: APPENDECTOMY LAPAROSCOPIC (N/A Abdomen)     Patient location during evaluation: PACU Anesthesia Type: General Level of consciousness: awake and alert Pain management: pain level controlled Vital Signs Assessment: post-procedure vital signs reviewed and stable Respiratory status: spontaneous breathing, nonlabored ventilation, respiratory function stable and patient connected to nasal cannula oxygen Cardiovascular status: blood pressure returned to baseline and stable Postop Assessment: no apparent nausea or vomiting Anesthetic complications: no    Last Vitals:  Vitals:   07/15/17 2015 07/15/17 2124  BP: 120/77 109/64  Pulse: 79 69  Resp: 18 18  Temp: 36.9 C 36.8 C  SpO2: 97% 97%    Last Pain:  Vitals:   07/15/17 2124  TempSrc: Oral  PainSc:                  [name]

## 2017-07-15 NOTE — ED Notes (Signed)
ED Provider at bedside. 

## 2017-07-15 NOTE — Op Note (Signed)
Appendectomy, Lap, Procedure Note  Pre-operative Diagnosis: Acute appendicitis  Post-operative Diagnosis: Same  Procedure:  Laparoscopic appendectomy  Surgeon:  Jackolyn Confer, M.D.  Anesthesia:  General   Indications:  This is a 36 year old female presented to [facility] with right sided abdominal pain. CT scan demonstrated acute appendicitis without evidence of perforation. She's now brought to the operating room for appendectomy.   Procedure Details   She was brought to the operating room, placed in the supine position and general anesthesia was induced, along with placement of orogastric tube, SCDs, and a Foley catheter. A timeout was performed. The abdomen was prepped and draped in a sterile fashion. A small infraumbilical incision was made through the skin, subcutaneous tissue, fascia, and peritoneum entering the peritoneal cavity under direct vision.  A pursestring suture was passed around the fascia with a 0 Vicryl.  The Hasson was introduced into the peritoneal cavity and the tails of the suture were used to hold the Hasson in place.   The pneumoperitoneum was then established to steady pressure of 15 mmHg.   The laparoscope was introduced and there was no evidence of bleeding or underlying organ injury. The appendix was known to be in the right upper quadrant based on the CT scan. The cecum was visualized in the right upper quadrant. A 5 mm trocar was placed in the left upper quadrant. A 5 mm trocar was placed in the right lower quadrant.  The appendix was carefully mobilized and freed from its attachments to the retroperitoneum in the right upper quadrant. The mesoappendix was was divided with the harmonic scalpel.   The appendix was amputated off the cecum, with a small cuff of cecum, using an endo-GIA stapler.  The appendix was placed in a retrieval bag and removed through the subumbilical port incision.    There is bleeding from the staple line which was controlled with  clips.  Copious irrigation was  performed and irrigant fluid suctioned from the abdomen as much as possible. The abdominal cavity was inspected and there was no evidence of organ injury or bleeding.   The umbilical trocar was removed and the  port site fascia was closed via the purse string suture under laparoscopic vision. There was no residual palpable fascial defect.  The remaining trocars were removed and all  trocar site skin wounds were closed with 4-0 Monocryl. Steri-Strips and sterile dressings were applied.   Instrument, sponge, and needle counts were correct at the conclusion of the case.   Findings: The appendix was found to be inflamed. There were not signs of necrosis.  There was not perforation. There was not abscess formation. Note: The cecum is in the right upper quadrant.  Estimated Blood Loss:  100 ml                 Specimens: appendix         Complications:  None; patient tolerated the procedure well.         Disposition: PACU - hemodynamically stable.         Condition: stable

## 2017-07-15 NOTE — Transfer of Care (Signed)
Immediate Anesthesia Transfer of Care Note  Patient: Andrea [name]  Procedure(s) Performed: APPENDECTOMY LAPAROSCOPIC (N/A Abdomen)  Patient Location: PACU  Anesthesia Type:General  Level of Consciousness: sedated  Airway & Oxygen Therapy: Patient Spontanous Breathing and Patient connected to face mask oxygen  Post-op Assessment: Report given to RN and Post -op Vital signs reviewed and stable  Post vital signs: Reviewed and stable  Last Vitals:  Vitals:   07/15/17 1250 07/15/17 1420  BP: 111/82 116/85  Pulse: 88 98  Resp: 16 18  Temp:  36.7 C  SpO2: 100% 100%    Last Pain:  Vitals:   07/15/17 1432  TempSrc:   PainSc: 2          Complications: No apparent anesthesia complications

## 2017-07-15 NOTE — Anesthesia Procedure Notes (Signed)
Procedure Name: Intubation Date/Time: 07/15/2017 3:49 PM Performed by: Talbot Grumbling Pre-anesthesia Checklist: Patient identified, Emergency Drugs available, Suction available and Patient being monitored Patient Re-evaluated:Patient Re-evaluated prior to induction Oxygen Delivery Method: Circle system utilized Preoxygenation: Pre-oxygenation with 100% oxygen Induction Type: IV induction Laryngoscope Size: Mac and 4 Grade View: Grade I Tube size: 7.5 mm Number of attempts: 1 Airway Equipment and Method: Stylet Placement Confirmation: ETT inserted through vocal cords under direct vision,  positive ETCO2 and breath sounds checked- equal and bilateral Secured at: 22 cm Tube secured with: Tape Dental Injury: Teeth and Oropharynx as per pre-operative assessment

## 2017-07-15 NOTE — ED Triage Notes (Signed)
Pt reports mid abd pain, onset last night. +gas and bloating. Back Pain. Home measures not effective

## 2017-07-15 NOTE — ED Provider Notes (Signed)
[facility] EMERGENCY DEPARTMENT Provider Note   CSN: [id number] Arrival date & time: 07/15/17  0941     History   Chief Complaint Chief Complaint  Patient presents with  . Abdominal Pain    HPI Andrea [name] is a 36 y.o. female.  HPI Andrea [name] is a 36 y.o. female with history of bipolar disorder, presents to emergency department complaining of abdominal pain.  Patient states that she has had some bloating and some abdominal discomfort last night.  She states she tried to pass some gas with some relief.  Around 3 AM this morning, patient woke up with severe right-sided abdominal pain.  She reports associated nausea.  She had "large and hard" bowel movement this morning.  Pain was not subsiding so she went and saw her primary care doctor who sent her here for imaging.  She denies any vomiting.  She denies any history of similar pain in the past.  She states pain is mainly on the right side, sharp.  She states that radiates to both upper and lower abdomen.  Denies any vaginal discharge or bleeding.  Denies any urinary reports dull pain to bilateral lower back.  History of liposuction, cesarean section, otherwise no abdominal surgeries.  Took Tums with some relief of her symptoms.  Past Medical History:  Diagnosis Date  . ADHD   . Hypomanic bipolar II disorder [facility])     Patient Active Problem List   Diagnosis Date Noted  . Physical exam 04/24/2017  . ADD (attention deficit disorder) 06/18/2016  . Obesity (BMI 30-39.9) 06/18/2016  . Hypomanic bipolar II disorder [facility])     Past Surgical History:  Procedure Laterality Date  . CESAREAN SECTION  10/2014    OB History    No data available       Home Medications    Prior to Admission medications   Medication Sig Start Date End Date Taking? Authorizing Provider  amphetamine-dextroamphetamine (ADDERALL XR) 20 MG 24 hr capsule Take 1 capsule (20 mg total) by mouth daily. 04/02/17   Midge Minium, MD     Family History Family History  Problem Relation Age of Onset  . Cancer Maternal Grandmother        Uterine Cancer  . Healthy Mother   . Healthy Father     Social History Social History  Substance Use Topics  . Smoking status: Never Smoker  . Smokeless tobacco: Never Used  . Alcohol use Yes     Comment: wine, weekly     Allergies   Lamictal [lamotrigine]   Review of Systems Review of Systems  Constitutional: Negative for chills and fever.  Respiratory: Negative for cough, chest tightness and shortness of breath.   Cardiovascular: Negative for chest pain, palpitations and leg swelling.  Gastrointestinal: Positive for abdominal pain and nausea. Negative for blood in stool, constipation, diarrhea and vomiting.  Genitourinary: Negative for dysuria, flank pain, pelvic pain, vaginal bleeding, vaginal discharge and vaginal pain.  Musculoskeletal: Negative for arthralgias, myalgias, neck pain and neck stiffness.  Skin: Negative for rash.  Neurological: Negative for dizziness, weakness and headaches.  All other systems reviewed and are negative.    Physical Exam Updated Vital Signs BP (!) 118/97 (BP Location: Left Arm)   Pulse 85   Temp 99.5 F (37.5 C) (Oral)   Resp 18   Ht 5\' 5"  (1.651 m)   Wt 104.3 kg (230 lb)   LMP 05/26/2017   SpO2 100%   BMI 38.27 kg/m  Physical Exam  Constitutional: She appears well-developed and well-nourished.  Crying in pain  HENT:  Head: Normocephalic.  Eyes: Conjunctivae are normal.  Neck: Neck supple.  Cardiovascular: Normal rate, regular rhythm and normal heart sounds.   Pulmonary/Chest: Effort normal and breath sounds normal. No respiratory distress. She has no wheezes. She has no rales.  Abdominal: Soft. Bowel sounds are normal. She exhibits no distension. There is tenderness. There is no rebound.  Right upper quadrant and right lower quadrant tenderness.  Some voluntary guarding  Musculoskeletal: She exhibits no edema.   Neurological: She is alert.  Skin: Skin is warm and dry.  Psychiatric: She has a normal mood and affect. Her behavior is normal.  Nursing note and vitals reviewed.    ED Treatments / Results  Labs (all labs ordered are listed, but only abnormal results are displayed) Labs Reviewed  CBC - Abnormal; Notable for the following:       Result Value   WBC 10.9 (*)    Hemoglobin 11.8 (*)    HCT 35.4 (*)    All other components within normal limits  COMPREHENSIVE METABOLIC PANEL - Abnormal; Notable for the following:    CO2 20 (*)    Glucose, Bld 102 (*)    AST 13 (*)    All other components within normal limits  I-STAT CHEM 8, ED - Abnormal; Notable for the following:    Calcium, Ion 0.94 (*)    TCO2 19 (*)    All other components within normal limits  URINALYSIS, ROUTINE W REFLEX MICROSCOPIC  PREGNANCY, URINE  LIPASE, BLOOD    EKG  EKG Interpretation None       Radiology Ct Abdomen Pelvis W Contrast  Result Date: 07/15/2017 CLINICAL DATA:  36 year old female with a history of right lower quadrant pain EXAM: CT ABDOMEN AND PELVIS WITH CONTRAST TECHNIQUE: Multidetector CT imaging of the abdomen and pelvis was performed using the standard protocol following bolus administration of intravenous contrast. CONTRAST:  192mL ISOVUE-300 IOPAMIDOL (ISOVUE-300) INJECTION 61% COMPARISON:  None. FINDINGS: Lower chest: No acute abnormality. Hepatobiliary: No focal liver abnormality is seen. No gallstones, gallbladder wall thickening, or biliary dilatation. Pancreas: Unremarkable. No pancreatic ductal dilatation or surrounding inflammatory changes. Spleen: Normal in size without focal abnormality. Adrenals/Urinary Tract: Unremarkable adrenal glands. Unremarkable appearance the bilateral kidneys with no hydronephrosis or nephrolithiasis. Unremarkable course the bilateral ureters. Unremarkable appearance of urinary bladder. Stomach/Bowel: Unremarkable stomach. Unremarkable small bowel without  abnormal distention. No transition point. Thickened appendix with inflammatory changes and high density material within the lumen of the appendix. Note that the appendix is at the inferior liver margin. No appendicolith. No abscess. No perforation. Vascular/Lymphatic: Unremarkable appearance of the abdominal aorta. No aneurysm or dissection. No periaortic fluid. Small lymph nodes in the periaortic region and within the ileocolic mesentery. Reproductive: Unremarkable appearance of the uterus and adnexa Other: Small fat containing umbilical hernia. Musculoskeletal: No acute displaced fracture. IMPRESSION: Acute appendicitis without complicating feature. Note that the appendix is retrohepatic, adjacent to the posterior right lobe. These results were called by telephone at the time of interpretation on 07/15/2017 at 12:14 pm to Dr. Jeannett Senior , who verbally acknowledged these results. Electronically Signed   By: Corrie Mckusick D.O.   On: 07/15/2017 12:17    Procedures Procedures (including critical care time)  Medications Ordered in ED Medications  HYDROmorphone (DILAUDID) injection 0.5 mg (not administered)  ondansetron (ZOFRAN) injection 4 mg (4 mg Intravenous Given 07/15/17 1045)     Initial Impression /  Assessment and Plan / ED Course  I have reviewed the triage vital signs and the nursing notes.  Pertinent labs & imaging results that were available during my care of the patient were reviewed by me and considered in my medical decision making (see chart for details).     Patient with severe abdominal pain onset around 3 AM, preceded by bloating and abdominal distention.  On exam right-sided tenderness, both in right upper quadrant and right lower quadrant at McBurney's point.  No adnexal or suprapubic tenderness.  Differential includes cholelithiasis, cholecystitis, appendicitis, small bowel obstruction.  Patient was sent here for imaging.  Will get labs, CT abdomen and pelvis.  Patient is  not pregnant.  CT scan showing acute appendicitis.  Discussed with Dr. Zella Richer, who agrees to accept patient in transfer.  Antibiotics ordered.  Patient updated.  Will give another dose of pain medication prior to transfer.  Vitals:   07/15/17 1226 07/15/17 1250 07/15/17 1420 07/15/17 1432  BP: 125/78 111/82 116/85   Pulse: 98 88 98   Resp: 16 16 18    Temp:   98.1 F (36.7 C)   TempSrc:   Oral   SpO2: 100% 100% 100%   Weight:    104.3 kg (230 lb)  Height:    5\' 5"  (1.651 m)     Final Clinical Impressions(s) / ED Diagnoses   Final diagnoses:  Acute appendicitis, unspecified acute appendicitis type    New Prescriptions Current Discharge Medication List      Jeannett Senior, PA-C 07/15/17 1617  Jeannett Senior, PA-C 07/15/17 1617  Gareth Morgan, MD 07/17/17 1243

## 2017-07-15 NOTE — H&P (Signed)
Andrea [name] is an 36 y.o. female.   Chief Complaint:  Severe right sided abdominal pain HPI:  This is a 36 year old female who had the onset of some periumbilical pain yesterday that intensified and radiated around the right side of the abdomen. It was very severe this morning and she went to [facility] for evaluation. While there she had an episode of nausea and vomiting. There is concern for appendicitis and a CT scan was done which demonstrated findings consistent with acute appendicitis. The tip of the appendix is inferior to the edge of the liver. She subsequently was transferred to [facility] for further treatment.  Past Medical History:  Diagnosis Date  . ADHD   . Hypomanic bipolar II disorder [facility])     Past Surgical History:  Procedure Laterality Date  . CESAREAN SECTION  10/2014    Family History  Problem Relation Age of Onset  . Cancer Maternal Grandmother        Uterine Cancer  . Healthy Mother   . Healthy Father    Social History:  reports that she has never smoked. She has never used smokeless tobacco. She reports that she drinks alcohol. She reports that she does not use drugs.  Allergies:  Allergies  Allergen Reactions  . Lamictal [Lamotrigine] Other (See Comments)    STS syndrome    Medications Prior to Admission  Medication Sig Dispense Refill  . amphetamine-dextroamphetamine (ADDERALL XR) 20 MG 24 hr capsule Take 1 capsule (20 mg total) by mouth daily. 30 capsule 0    Results for orders placed or performed during the hospital encounter of 07/15/17 (from the past 48 hour(s))  Urinalysis, Routine w reflex microscopic     Status: None   Collection Time: 07/15/17 10:26 AM  Result Value Ref Range   Color, Urine YELLOW YELLOW   APPearance CLEAR CLEAR   Specific Gravity, Urine 1.010 1.005 - 1.030   pH 6.0 5.0 - 8.0   Glucose, UA NEGATIVE NEGATIVE mg/dL   Hgb urine dipstick NEGATIVE NEGATIVE   Bilirubin Urine NEGATIVE NEGATIVE    Ketones, ur NEGATIVE NEGATIVE mg/dL   Protein, ur NEGATIVE NEGATIVE mg/dL   Nitrite NEGATIVE NEGATIVE   Leukocytes, UA NEGATIVE NEGATIVE    Comment: Microscopic not done on urines with negative protein, blood, leukocytes, nitrite, or glucose < 500 mg/dL.  Pregnancy, urine     Status: None   Collection Time: 07/15/17 10:26 AM  Result Value Ref Range   Preg Test, Ur NEGATIVE NEGATIVE    Comment:        THE SENSITIVITY OF THIS METHODOLOGY IS >20 mIU/mL.   I-Stat Chem 8, ED     Status: Abnormal   Collection Time: 07/15/17 11:05 AM  Result Value Ref Range   Sodium 137 135 - 145 mmol/L   Potassium 4.3 3.5 - 5.1 mmol/L   Chloride 111 101 - 111 mmol/L   BUN 7 6 - 20 mg/dL   Creatinine, Ser 0.50 0.44 - 1.00 mg/dL   Glucose, Bld 96 65 - 99 mg/dL   Calcium, Ion 0.94 (L) 1.15 - 1.40 mmol/L   TCO2 19 (L) 22 - 32 mmol/L   Hemoglobin 13.3 12.0 - 15.0 g/dL   HCT 39.0 36.0 - 46.0 %  CBC     Status: Abnormal   Collection Time: 07/15/17 11:10 AM  Result Value Ref Range   WBC 10.9 (H) 4.0 - 10.5 K/uL   RBC 3.90 3.87 - 5.11 MIL/uL   Hemoglobin 11.8 (  L) 12.0 - 15.0 g/dL   HCT 35.4 (L) 36.0 - 46.0 %   MCV 90.8 78.0 - 100.0 fL   MCH 30.3 26.0 - 34.0 pg   MCHC 33.3 30.0 - 36.0 g/dL   RDW 12.0 11.5 - 15.5 %   Platelets 274 150 - 400 K/uL  Comprehensive metabolic panel     Status: Abnormal   Collection Time: 07/15/17 11:10 AM  Result Value Ref Range   Sodium 136 135 - 145 mmol/L   Potassium 3.6 3.5 - 5.1 mmol/L   Chloride 107 101 - 111 mmol/L   CO2 20 (L) 22 - 32 mmol/L   Glucose, Bld 102 (H) 65 - 99 mg/dL   BUN 6 6 - 20 mg/dL   Creatinine, Ser 0.57 0.44 - 1.00 mg/dL   Calcium 8.9 8.9 - 10.3 mg/dL   Total Protein 6.8 6.5 - 8.1 g/dL   Albumin 4.1 3.5 - 5.0 g/dL   AST 13 (L) 15 - 41 U/L   ALT 20 14 - 54 U/L   Alkaline Phosphatase 63 38 - 126 U/L   Total Bilirubin 0.6 0.3 - 1.2 mg/dL   GFR calc non Af Amer >60 >60 mL/min   GFR calc Af Amer >60 >60 mL/min    Comment: (NOTE) The eGFR has  been calculated using the CKD EPI equation. This calculation has not been validated in all clinical situations. eGFR's persistently <60 mL/min signify possible Chronic Kidney Disease.    Anion gap 9 5 - 15    Comment: Performed at [facility] [address], [place], [place] [address]  Lipase, blood     Status: None   Collection Time: 07/15/17 11:10 AM  Result Value Ref Range   Lipase 26 11 - 51 U/L    Comment: Performed at [facility] [address] [place], [place] [address]   Ct Abdomen Pelvis W Contrast  Result Date: 07/15/2017 CLINICAL DATA:  36 year old female with a history of right lower quadrant pain EXAM: CT ABDOMEN AND PELVIS WITH CONTRAST TECHNIQUE: Multidetector CT imaging of the abdomen and pelvis was performed using the standard protocol following bolus administration of intravenous contrast. CONTRAST:  134m ISOVUE-300 IOPAMIDOL (ISOVUE-300) INJECTION 61% COMPARISON:  None. FINDINGS: Lower chest: No acute abnormality. Hepatobiliary: No focal liver abnormality is seen. No gallstones, gallbladder wall thickening, or biliary dilatation. Pancreas: Unremarkable. No pancreatic ductal dilatation or surrounding inflammatory changes. Spleen: Normal in size without focal abnormality. Adrenals/Urinary Tract: Unremarkable adrenal glands. Unremarkable appearance the bilateral kidneys with no hydronephrosis or nephrolithiasis. Unremarkable course the bilateral ureters. Unremarkable appearance of urinary bladder. Stomach/Bowel: Unremarkable stomach. Unremarkable small bowel without abnormal distention. No transition point. Thickened appendix with inflammatory changes and high density material within the lumen of the appendix. Note that the appendix is at the inferior liver margin. No appendicolith. No abscess. No perforation. Vascular/Lymphatic: Unremarkable appearance of the abdominal aorta. No aneurysm or dissection. No periaortic fluid. Small lymph nodes in the periaortic region  and within the ileocolic mesentery. Reproductive: Unremarkable appearance of the uterus and adnexa Other: Small fat containing umbilical hernia. Musculoskeletal: No acute displaced fracture. IMPRESSION: Acute appendicitis without complicating feature. Note that the appendix is retrohepatic, adjacent to the posterior right lobe. These results were called by telephone at the time of interpretation on 07/15/2017 at 12:14 pm to Dr. TJeannett Senior, who verbally acknowledged these results. Electronically Signed   By: JCorrie MckusickD.O.   On: 07/15/2017 12:17    Review of  Systems  Constitutional: Positive for chills. Negative for fever.  HENT: Negative for congestion and sore throat.   Respiratory: Negative for cough and sputum production.   Cardiovascular: Negative for chest pain.  Gastrointestinal: Positive for abdominal pain, nausea and vomiting. Negative for diarrhea.  Genitourinary: Negative for dysuria and hematuria.  Neurological: Negative for seizures.  Endo/Heme/Allergies:       No bleeding disorders or blood clots.    Blood pressure 116/85, pulse 98, temperature 98.1 F (36.7 C), temperature source Oral, resp. rate 18, height '5\' 5"'  (1.651 m), weight 104.3 kg (230 lb), last menstrual period 05/31/2017, SpO2 100 %. Physical Exam  GENERAL APPEARANCE:  Obese female in NAD.  Pleasant and cooperative.  EARS, NOSE, MOUTH THROAT:  Reading/AT external ears:  no lesions or deformities external nose:  no lesions or deformities hearing:  grossly normal lips:  moist, no deformities EYES external: conjunctiva, lids, sclerae normal pupils:  equal, round glasses: yes  CV ascultation:  RRR, no murmur extremity edema:  no  RESP auscultation:  breath sounds equal and clear respiratory effort:  normal   GASTROINTESTINAL abdomen:  Soft, right-sided abdominal and flank tenderness with some guarding, non-distended, no masses liver and spleen:  not enlarged. hernia:  none present scar:  Lower  transverse  SKIN No jaundice rash or lesion: none subcutaneous nodules:  none  NEUROLOGIC speech:  normal  PSYCHIATRIC alertness and orientation:  normal mood/affect/behavior:  normal judgement and insight:  normal   Assessment/Plan Acute appendicitis that does not appear to be perforated on CT scan. IV antibiotics have been given.  Plan: Laparoscopic possible open appendectomy.I have discussed the procedure and risks of appendectomy. The risks include but are not limited to bleeding, infection, wound problems, anesthesia, injury to intra-abdominal organs, possibility of postoperative ileus. She seems to understand and agrees with the plan.  Odis Hollingshead, MD 07/15/2017, 3:31 PM

## 2017-07-15 NOTE — Discharge Instructions (Signed)
[facility] ______[facility] LAPAROSCOPIC APPENDECTOMY SURGERY: POST OP INSTRUCTIONS Always review your discharge instruction sheet given to you by the facility where your surgery was performed. IF YOU HAVE DISABILITY OR FAMILY LEAVE FORMS, YOU MUST BRING THEM TO THE OFFICE FOR PROCESSING.   DO NOT GIVE THEM TO YOUR DOCTOR.  1. A prescription for pain medication may be given to you upon discharge.  Take your pain medication as prescribed, if needed.  If narcotic pain medicine is not needed, then you may take acetaminophen (Tylenol) or ibuprofen (Advil) as needed. 2. Take your usually prescribed medications unless otherwise directed. 3. If you need a refill on your pain medication, please contact your pharmacy.  They will contact our office to request authorization. Prescriptions will not be filled after 5pm or on week-ends. 4. You should follow a light diet the first few days after arrival home, such as soup and crackers, etc.  Be sure to include lots of fluids daily.  May start lowfat, solid foods 2 days after the surgery. 5. Most patients will experience some swelling and bruising in the area of the incisions.  Ice packs will help.  Swelling and bruising can take several days to resolve.  6. It is common to experience some constipation if taking pain medication after surgery.  Increasing fluid intake and taking a stool softener (such as Colace) will usually help or prevent this problem from occurring.  A mild laxative (Milk of Magnesia or Miralax) should be taken according to package instructions if there are no bowel movements after 48 hours. 7. Unless discharge instructions indicate otherwise, you may remove your bandages on 3 days after surgery.  You may shower when you get home.  You may have steri-strips (small skin tapes) in place directly over the incision.  These strips should be left on the skin until they fall off.  If your surgeon used skin glue on the incision, you may shower in  24 hours.  The glue will flake off over the next 2-3 weeks.  Any sutures or staples will be removed at the office during your follow-up visit. 8. ACTIVITIES:  You may resume regular (light) daily activities beginning the next day--such as daily self-care, walking, climbing stairs--gradually increasing activities as tolerated.  You may have sexual intercourse when it is comfortable.  Refrain from any heavy lifting or straining for two weeks.  Do not lift anything over 10 pounds during that time.  a. You may drive when you are no longer taking prescription pain medication, you can comfortably wear a seatbelt, and you can safely maneuver your car and apply brakes. b. RETURN TO WORK:  Desk type work in 1 week, full duty work in 2 weeks if you are pain-free.________________________________________________________ 9. You should see your doctor or a PA in the office for a follow-up appointment approximately 2-3 weeks after your surgery.  Make sure that you call for this appointment within a day or two after you arrive home to insure a convenient appointment time. 10. OTHER INSTRUCTIONS: __________________________________________________________________________________________________________________________ __________________________________________________________________________________________________________________________ WHEN TO CALL YOUR DOCTOR: 1. Fever over 101.0 2. Inability to urinate 3. Continued bleeding from incision. 4. Increased pain, redness, or drainage from the incision. 5. Increasing abdominal pain  The clinic staff is available to answer your questions during regular business hours.  Please dont hesitate to call and ask to speak to one of the nurses for clinical concerns.  If you have a medical emergency, go to the nearest emergency room or call 911.  A surgeon from [facility] is always on call at the hospital. [address] [place], [place], [place]  [address] ?  [address], [place], [place]   [address] [phone] ? [phone] ? FAX [phone] Web site: [email or web address]

## 2017-07-15 NOTE — Interval H&P Note (Signed)
History and Physical Interval Note:  07/15/2017 3:35 PM  Andrea [name]  has presented today for surgery, with the diagnosis of appendicitis  The various methods of treatment have been discussed with the patient and family. After consideration of risks, benefits and other options for treatment, the patient has consented to  Procedure(s): APPENDECTOMY LAPAROSCOPIC (N/A) as a surgical intervention .  The patient's history has been reviewed, patient examined, no change in status, stable for surgery.  I have reviewed the patient's chart and labs.  Questions were answered to the patient's satisfaction.     Beatrice Sehgal Lenna Sciara

## 2017-07-15 NOTE — Anesthesia Preprocedure Evaluation (Addendum)
Anesthesia Evaluation  Patient identified by MRN, date of birth, ID band Patient awake    Reviewed: Allergy & Precautions, NPO status , Patient's Chart, lab work & pertinent test results  Airway Mallampati: II  TM Distance: >3 FB Neck ROM: Full    Dental  (+) Chipped, Dental Advisory Given,    Pulmonary neg pulmonary ROS,    breath sounds clear to auscultation       Cardiovascular negative cardio ROS   Rhythm:Regular Rate:Normal     Neuro/Psych PSYCHIATRIC DISORDERS Bipolar Disorder negative neurological ROS     GI/Hepatic negative GI ROS, Neg liver ROS,   Endo/Other  negative endocrine ROS  Renal/GU negative Renal ROS     Musculoskeletal negative musculoskeletal ROS (+)   Abdominal (+) + obese,   Peds  Hematology negative hematology ROS (+)   Anesthesia Other Findings Day of surgery medications reviewed with the patient.  Reproductive/Obstetrics                            Anesthesia Physical Anesthesia Plan  ASA: II  Anesthesia Plan: General   Post-op Pain Management:    Induction: Intravenous  PONV Risk Score and Plan: 4 or greater and Ondansetron, Dexamethasone, Midazolam, Scopolamine patch - Pre-op and Treatment may vary due to age or medical condition  Airway Management Planned: Oral ETT  Additional Equipment:   Intra-op Plan:   Post-operative Plan: Extubation in OR  Informed Consent: I have reviewed the patients History and Physical, chart, labs and discussed the procedure including the risks, benefits and alternatives for the proposed anesthesia with the patient or authorized representative who has indicated his/her understanding and acceptance.   Dental advisory given  Plan Discussed with: CRNA  Anesthesia Plan Comments:         Anesthesia Quick Evaluation

## 2017-07-16 ENCOUNTER — Encounter (HOSPITAL_COMMUNITY): Payer: Self-pay | Admitting: General Surgery

## 2017-07-16 MED ORDER — HYDROCODONE-ACETAMINOPHEN 5-325 MG PO TABS: 1.0000 | ORAL_TABLET | ORAL | 0 refills | Status: DC | PRN

## 2017-07-16 MED ORDER — ONDANSETRON 4 MG PO TBDP: 4.0000 mg | ORAL_TABLET | Freq: Four times a day (QID) | ORAL | 0 refills | Status: DC | PRN

## 2017-07-16 NOTE — Discharge Summary (Signed)
[facility] Discharge Summary   Patient ID: Andrea [name] MRN: [id number] DOB/AGE: 35-Aug-1982 35 y.o.  Admit date: 07/15/2017 Discharge date: 07/16/2017  Admitting Diagnosis: appendicitis  Discharge Diagnosis Patient Active Problem List   Diagnosis Date Noted  . Acute appendicitis 07/15/2017  . Physical exam 04/24/2017  . ADD (attention deficit disorder) 06/18/2016  . Obesity (BMI 30-39.9) 06/18/2016  . Hypomanic bipolar II disorder ([place])     Consultants none  Imaging: Ct Abdomen Pelvis W Contrast  Result Date: 07/15/2017 CLINICAL DATA:  36 year old female with a history of right lower quadrant pain EXAM: CT ABDOMEN AND PELVIS WITH CONTRAST TECHNIQUE: Multidetector CT imaging of the abdomen and pelvis was performed using the standard protocol following bolus administration of intravenous contrast. CONTRAST:  11mL ISOVUE-300 IOPAMIDOL (ISOVUE-300) INJECTION 61% COMPARISON:  None. FINDINGS: Lower chest: No acute abnormality. Hepatobiliary: No focal liver abnormality is seen. No gallstones, gallbladder wall thickening, or biliary dilatation. Pancreas: Unremarkable. No pancreatic ductal dilatation or surrounding inflammatory changes. Spleen: Normal in size without focal abnormality. Adrenals/Urinary Tract: Unremarkable adrenal glands. Unremarkable appearance the bilateral kidneys with no hydronephrosis or nephrolithiasis. Unremarkable course the bilateral ureters. Unremarkable appearance of urinary bladder. Stomach/Bowel: Unremarkable stomach. Unremarkable small bowel without abnormal distention. No transition point. Thickened appendix with inflammatory changes and high density material within the lumen of the appendix. Note that the appendix is at the inferior liver margin. No appendicolith. No abscess. No perforation. Vascular/Lymphatic: Unremarkable appearance of the abdominal aorta. No aneurysm or dissection. No periaortic fluid. Small lymph nodes in the periaortic  region and within the ileocolic mesentery. Reproductive: Unremarkable appearance of the uterus and adnexa Other: Small fat containing umbilical hernia. Musculoskeletal: No acute displaced fracture. IMPRESSION: Acute appendicitis without complicating feature. Note that the appendix is retrohepatic, adjacent to the posterior right lobe. These results were called by telephone at the time of interpretation on 07/15/2017 at 12:14 pm to Dr. Jeannett Senior , who verbally acknowledged these results. Electronically Signed   By: Corrie Mckusick D.O.   On: 07/15/2017 12:17    Procedures Dr. Zella Richer (07/15/17) -  Laparoscopic Appendectomy  Hospital Course:  [name] who presented to ED with abdominal pain.  Workup showed appendicitis.  Patient was admitted and underwent procedure listed above.  Tolerated procedure well and was transferred to the floor.  Diet was advanced as tolerated.  On POD#1, the patient was voiding well, tolerating diet, ambulating well, pain well controlled, vital signs stable, incisions c/d/i and felt stable for discharge home.  Patient will follow up in our office in 2 weeks and knows to call with questions or concerns.     Patient was discharged in good condition.  The [place] Substance controlled database was reviewed prior to prescribing narcotic pain medication to this patient.  Physical Exam: General:  Alert, NAD, pleasant, cooperative Cardio: RRR, S1 & S2 normal, no murmur, rubs, gallops Resp: [place] normal, lungs CTA bilaterally, no wheezes, rales, rhonchi Abd:  Soft, ND, normal bowel sounds, mild TTP in RUQ and RLQ, no guarding, incisions C/D/I Skin: warm and dry  Allergies as of 07/16/2017      Reactions   Lamictal [lamotrigine] Other (See Comments)   STS syndrome      Medication List    TAKE these medications   amphetamine-dextroamphetamine 20 MG 24 hr capsule Commonly known as:  ADDERALL XR Take 1 capsule (20 mg total) by mouth  daily.   calcium carbonate 500 MG chewable tablet Commonly known as:  TUMS - dosed in mg elemental calcium Chew 2 tablets by mouth 2 (two) times daily as needed for indigestion or heartburn.   HYDROcodone-acetaminophen 5-325 MG tablet Commonly known as:  NORCO/VICODIN Take 1 tablet by mouth every 4 (four) hours as needed for moderate pain.   ondansetron 4 MG disintegrating tablet Commonly known as:  ZOFRAN-ODT Take 1 tablet (4 mg total) by mouth every 6 (six) hours as needed for nausea.        Follow-up [facility] [place]. Call.   Specialty:  General Surgery Why:  to confirmy your appointment date and time  Contact information: [address] [place] [phone]          Signed: [facility] 07/16/2017, 8:07 AM Pager: [phone] Consults: [phone] Mon-Fri 7:00 am-4:30 pm Sat-Sun 7:00 am-11:30 am

## 2017-07-23 ENCOUNTER — Encounter: Payer: Self-pay | Admitting: Family Medicine

## 2017-11-12 ENCOUNTER — Encounter: Payer: Self-pay | Admitting: Family Medicine

## 2017-11-20 ENCOUNTER — Other Ambulatory Visit: Payer: Self-pay

## 2017-11-20 ENCOUNTER — Ambulatory Visit: Payer: BLUE CROSS/BLUE SHIELD | Admitting: Family Medicine

## 2017-11-20 ENCOUNTER — Encounter: Payer: Self-pay | Admitting: Family Medicine

## 2017-11-20 VITALS — BP 121/81 | HR 76 | Temp 98.9°F | Resp 16 | Ht 65.0 in | Wt 237.5 lb

## 2017-11-20 DIAGNOSIS — E669 Obesity, unspecified: Secondary | ICD-10-CM | POA: Diagnosis not present

## 2017-11-20 LAB — BASIC METABOLIC PANEL
BUN: 11 mg/dL (ref 6–23)
CALCIUM: 9.5 mg/dL (ref 8.4–10.5)
CO2: 26 meq/L (ref 19–32)
CREATININE: 0.59 mg/dL (ref 0.40–1.20)
Chloride: 104 mEq/L (ref 96–112)
GFR: 122.75 mL/min (ref >60.00)
Glucose, Bld: 78 mg/dL (ref 70–99)
Potassium: 4 mEq/L (ref 3.5–5.1)
Sodium: 137 mEq/L (ref 135–145)

## 2017-11-20 LAB — HEPATIC FUNCTION PANEL
ALT: 16 U/L (ref 0–35)
AST: 12 U/L (ref 0–37)
Albumin: 4.3 g/dL (ref 3.5–5.2)
Alkaline Phosphatase: 65 U/L (ref 39–117)
Bilirubin, Direct: 0.1 mg/dL (ref 0.0–0.3)
TOTAL PROTEIN: 7.6 g/dL (ref 6.0–8.3)
Total Bilirubin: 0.5 mg/dL (ref 0.2–1.2)

## 2017-11-20 LAB — LIPID PANEL
CHOL/HDL RATIO: 3
Cholesterol: 138 mg/dL (ref 0–200)
HDL: 54.5 mg/dL (ref >39.00)
LDL Cholesterol: 61 mg/dL (ref 0–99)
NonHDL: 83.03
TRIGLYCERIDES: 108 mg/dL (ref 0.0–149.0)
VLDL: 21.6 mg/dL (ref 0.0–40.0)

## 2017-11-20 LAB — CBC WITH DIFFERENTIAL/PLATELET
BASOS ABS: 0 10*3/uL (ref 0.0–0.1)
BASOS PCT: 0.4 % (ref 0.0–3.0)
EOS ABS: 0.1 10*3/uL (ref 0.0–0.7)
Eosinophils Relative: 2.2 % (ref 0.0–5.0)
HEMATOCRIT: 38.9 % (ref 36.0–46.0)
Hemoglobin: 13.1 g/dL (ref 12.0–15.0)
LYMPHS PCT: 40.2 % (ref 12.0–46.0)
Lymphs Abs: 2.5 10*3/uL (ref 0.7–4.0)
MCHC: 33.8 g/dL (ref 30.0–36.0)
MCV: 92.6 fl (ref 78.0–100.0)
MONO ABS: 0.3 10*3/uL (ref 0.1–1.0)
Monocytes Relative: 4.1 % (ref 3.0–12.0)
Neutro Abs: 3.3 10*3/uL (ref 1.4–7.7)
Neutrophils Relative %: 53.1 % (ref 43.0–77.0)
Platelets: 332 10*3/uL (ref 150.0–400.0)
RBC: 4.2 Mil/uL (ref 3.87–5.11)
RDW: 13.2 % (ref 11.5–15.5)
WBC: 6.1 10*3/uL (ref 4.0–10.5)

## 2017-11-20 LAB — TSH: TSH: 1.42 u[IU]/mL (ref 0.35–4.50)

## 2017-11-20 LAB — HEMOGLOBIN A1C: HEMOGLOBIN A1C: 4.7 % (ref 4.6–6.5)

## 2017-11-20 NOTE — Patient Instructions (Addendum)
Schedule your physical for after 8/9 We'll notify you of your lab results and make any changes if needed Continue to work on healthy diet and regular exercise There is no reason on EKG not to proceed with your Weight Loss Program Call with any questions or concerns Andrea [name]!!

## 2017-11-20 NOTE — Assessment & Plan Note (Signed)
Ongoing issue for pt.  She is interested in starting a weight loss program called [facility].  Since this includes Phentermine, she is here today for medical clearance.  EKG WNL.  Check labs.  Stressed need for healthy diet and regular exercise in addition to this program.  Pt expressed understanding and is in agreement w/ plan.

## 2017-11-20 NOTE — Progress Notes (Signed)
   Subjective:    Patient ID: Andrea [name], female    DOB: 01-11-1981, 37 y.o.   MRN: [id number]  HPI Obesity- pt is interested in starting the '[facility]' Weight Loss program w/ Dr Cecile Hearing.  She is here today for medical clearance to start this- there is a phentermine component.  Denies CP, SOB, palpitations, edema.   Review of Systems For ROS see HPI     Objective:   Physical Exam  Constitutional: She is oriented to person, place, and time. She appears well-developed and well-nourished. No distress.  obese  HENT:  Head: Normocephalic and atraumatic.  Eyes: Conjunctivae and EOM are normal. Pupils are equal, round, and reactive to light.  Neck: Normal range of motion. Neck supple. No thyromegaly present.  Cardiovascular: Normal rate, regular rhythm, normal heart sounds and intact distal pulses.  No murmur heard. Pulmonary/Chest: Effort normal and breath sounds normal. No respiratory distress.  Abdominal: Soft. She exhibits no distension. There is no tenderness.  Musculoskeletal: She exhibits no edema.  Lymphadenopathy:    She has no cervical adenopathy.  Neurological: She is alert and oriented to person, place, and time.  Skin: Skin is warm and dry.  Psychiatric: She has a normal mood and affect. Her behavior is normal.  Vitals reviewed.         Assessment & Plan:

## 2017-11-21 ENCOUNTER — Encounter: Payer: Self-pay | Admitting: General Practice

## 2017-12-17 ENCOUNTER — Other Ambulatory Visit: Payer: Self-pay | Admitting: Family Medicine

## 2017-12-17 ENCOUNTER — Encounter: Payer: Self-pay | Admitting: Family Medicine

## 2017-12-17 DIAGNOSIS — Z3200 Encounter for pregnancy test, result unknown: Secondary | ICD-10-CM

## 2017-12-18 ENCOUNTER — Other Ambulatory Visit: Payer: Self-pay | Admitting: Family Medicine

## 2017-12-18 ENCOUNTER — Encounter: Payer: Self-pay | Admitting: Family Medicine

## 2017-12-18 ENCOUNTER — Other Ambulatory Visit (INDEPENDENT_AMBULATORY_CARE_PROVIDER_SITE_OTHER): Payer: BLUE CROSS/BLUE SHIELD

## 2017-12-18 DIAGNOSIS — Z3201 Encounter for pregnancy test, result positive: Secondary | ICD-10-CM

## 2017-12-18 DIAGNOSIS — Z3200 Encounter for pregnancy test, result unknown: Secondary | ICD-10-CM

## 2017-12-18 LAB — HCG, QUANTITATIVE, PREGNANCY: Quantitative HCG: 73.79 m[IU]/mL

## 2017-12-19 DIAGNOSIS — R21 Rash and other nonspecific skin eruption: Secondary | ICD-10-CM | POA: Diagnosis not present

## 2017-12-22 DIAGNOSIS — T7840XA Allergy, unspecified, initial encounter: Secondary | ICD-10-CM | POA: Diagnosis not present

## 2017-12-22 DIAGNOSIS — Z3687 Encounter for antenatal screening for uncertain dates: Secondary | ICD-10-CM | POA: Diagnosis not present

## 2017-12-22 DIAGNOSIS — R51 Headache: Secondary | ICD-10-CM | POA: Diagnosis not present

## 2017-12-22 DIAGNOSIS — L237 Allergic contact dermatitis due to plants, except food: Secondary | ICD-10-CM | POA: Diagnosis not present

## 2017-12-22 DIAGNOSIS — D225 Melanocytic nevi of trunk: Secondary | ICD-10-CM | POA: Diagnosis not present

## 2017-12-24 ENCOUNTER — Other Ambulatory Visit: Payer: Self-pay | Admitting: Obstetrics and Gynecology

## 2017-12-24 DIAGNOSIS — Z3687 Encounter for antenatal screening for uncertain dates: Secondary | ICD-10-CM | POA: Diagnosis not present

## 2017-12-25 DIAGNOSIS — N911 Secondary amenorrhea: Secondary | ICD-10-CM | POA: Diagnosis not present

## 2018-01-01 DIAGNOSIS — N911 Secondary amenorrhea: Secondary | ICD-10-CM | POA: Diagnosis not present

## 2018-01-15 DIAGNOSIS — Z3481 Encounter for supervision of other normal pregnancy, first trimester: Secondary | ICD-10-CM | POA: Diagnosis not present

## 2018-01-15 DIAGNOSIS — Z13228 Encounter for screening for other metabolic disorders: Secondary | ICD-10-CM | POA: Diagnosis not present

## 2018-01-15 DIAGNOSIS — Z3480 Encounter for supervision of other normal pregnancy, unspecified trimester: Secondary | ICD-10-CM | POA: Diagnosis not present

## 2018-01-15 DIAGNOSIS — Z3685 Encounter for antenatal screening for Streptococcus B: Secondary | ICD-10-CM | POA: Diagnosis not present

## 2018-01-15 DIAGNOSIS — Z3A08 8 weeks gestation of pregnancy: Secondary | ICD-10-CM | POA: Diagnosis not present

## 2018-01-15 LAB — OB RESULTS CONSOLE ABO/RH: RH TYPE: POSITIVE

## 2018-01-15 LAB — OB RESULTS CONSOLE GC/CHLAMYDIA
CHLAMYDIA, DNA PROBE: NEGATIVE
GC PROBE AMP, GENITAL: NEGATIVE

## 2018-01-15 LAB — OB RESULTS CONSOLE HIV ANTIBODY (ROUTINE TESTING): HIV: NONREACTIVE

## 2018-01-15 LAB — OB RESULTS CONSOLE RPR: RPR: NONREACTIVE

## 2018-01-15 LAB — OB RESULTS CONSOLE HEPATITIS B SURFACE ANTIGEN: Hepatitis B Surface Ag: NEGATIVE

## 2018-01-15 LAB — OB RESULTS CONSOLE RUBELLA ANTIBODY, IGM: RUBELLA: IMMUNE

## 2018-01-29 DIAGNOSIS — Z124 Encounter for screening for malignant neoplasm of cervix: Secondary | ICD-10-CM | POA: Diagnosis not present

## 2018-01-29 DIAGNOSIS — Z3A10 10 weeks gestation of pregnancy: Secondary | ICD-10-CM | POA: Diagnosis not present

## 2018-01-29 DIAGNOSIS — Z331 Pregnant state, incidental: Secondary | ICD-10-CM | POA: Diagnosis not present

## 2018-01-29 DIAGNOSIS — Z113 Encounter for screening for infections with a predominantly sexual mode of transmission: Secondary | ICD-10-CM | POA: Diagnosis not present

## 2018-01-29 DIAGNOSIS — Z3491 Encounter for supervision of normal pregnancy, unspecified, first trimester: Secondary | ICD-10-CM | POA: Diagnosis not present

## 2018-01-29 DIAGNOSIS — O26841 Uterine size-date discrepancy, first trimester: Secondary | ICD-10-CM | POA: Diagnosis not present

## 2018-02-14 ENCOUNTER — Inpatient Hospital Stay (HOSPITAL_COMMUNITY)
Admission: AD | Admit: 2018-02-14 | Discharge: 2018-02-15 | Disposition: A | Discharge status: Home / Self Care | Payer: BLUE CROSS/BLUE SHIELD | Source: Ambulatory Visit | Attending: Obstetrics and Gynecology | Admitting: Obstetrics and Gynecology

## 2018-02-14 ENCOUNTER — Encounter (HOSPITAL_COMMUNITY): Payer: Self-pay | Admitting: *Deleted

## 2018-02-14 DIAGNOSIS — R42 Dizziness and giddiness: Secondary | ICD-10-CM | POA: Diagnosis not present

## 2018-02-14 DIAGNOSIS — R51 Headache: Secondary | ICD-10-CM | POA: Insufficient documentation

## 2018-02-14 DIAGNOSIS — R197 Diarrhea, unspecified: Secondary | ICD-10-CM

## 2018-02-14 DIAGNOSIS — Z331 Pregnant state, incidental: Secondary | ICD-10-CM | POA: Diagnosis not present

## 2018-02-14 LAB — URINALYSIS, ROUTINE W REFLEX MICROSCOPIC
Bilirubin Urine: NEGATIVE
GLUCOSE, UA: NEGATIVE mg/dL
Hgb urine dipstick: NEGATIVE
KETONES UR: 5 mg/dL — AB
LEUKOCYTES UA: NEGATIVE
NITRITE: NEGATIVE
PROTEIN: NEGATIVE mg/dL
Specific Gravity, Urine: 1.031 — ABNORMAL HIGH (ref 1.005–1.030)
pH: 5 (ref 5.0–8.0)

## 2018-02-14 MED ORDER — LACTATED RINGERS IV BOLUS
1000.0000 mL | Freq: Once | INTRAVENOUS | Status: AC
  Administered 2018-02-15: 1000 mL via INTRAVENOUS

## 2018-02-14 NOTE — MAU Note (Signed)
Pt reports she has had diarrhea x 4 days. Feels dizzy and lightheaded and has headache.

## 2018-02-15 ENCOUNTER — Encounter (HOSPITAL_COMMUNITY): Payer: Self-pay | Admitting: Advanced Practice Midwife

## 2018-02-15 DIAGNOSIS — R197 Diarrhea, unspecified: Secondary | ICD-10-CM

## 2018-02-15 DIAGNOSIS — Z331 Pregnant state, incidental: Secondary | ICD-10-CM | POA: Diagnosis not present

## 2018-02-15 LAB — COMPREHENSIVE METABOLIC PANEL
ALBUMIN: 3.6 g/dL (ref 3.5–5.0)
ALT: 22 U/L (ref 14–54)
AST: 16 U/L (ref 15–41)
Alkaline Phosphatase: 48 U/L (ref 38–126)
Anion gap: 9 (ref 5–15)
BUN: 6 mg/dL (ref 6–20)
CHLORIDE: 108 mmol/L (ref 101–111)
CO2: 20 mmol/L — AB (ref 22–32)
Calcium: 9.1 mg/dL (ref 8.9–10.3)
Creatinine, Ser: 0.37 mg/dL — ABNORMAL LOW (ref 0.44–1.00)
GFR calc Af Amer: >60 mL/min (ref >60)
GFR calc non Af Amer: >60 mL/min (ref >60)
GLUCOSE: 95 mg/dL (ref 65–99)
POTASSIUM: 4.6 mmol/L (ref 3.5–5.1)
Sodium: 137 mmol/L (ref 135–145)
Total Bilirubin: 1 mg/dL (ref 0.3–1.2)
Total Protein: 6.8 g/dL (ref 6.5–8.1)

## 2018-02-15 LAB — CBC WITH DIFFERENTIAL/PLATELET
BASOS PCT: 1 %
Basophils Absolute: 0 10*3/uL (ref 0.0–0.1)
EOS PCT: 1 %
Eosinophils Absolute: 0.1 10*3/uL (ref 0.0–0.7)
HCT: 34 % — ABNORMAL LOW (ref 36.0–46.0)
Hemoglobin: 11.5 g/dL — ABNORMAL LOW (ref 12.0–15.0)
Lymphocytes Relative: 49 %
Lymphs Abs: 2.2 10*3/uL (ref 0.7–4.0)
MCH: 31.8 pg (ref 26.0–34.0)
MCHC: 33.8 g/dL (ref 30.0–36.0)
MCV: 93.9 fL (ref 78.0–100.0)
MONO ABS: 0.2 10*3/uL (ref 0.1–1.0)
Monocytes Relative: 4 %
NEUTROS ABS: 2 10*3/uL (ref 1.7–7.7)
Neutrophils Relative %: 45 %
PLATELETS: 223 10*3/uL (ref 150–400)
RBC: 3.62 MIL/uL — AB (ref 3.87–5.11)
RDW: 12.6 % (ref 11.5–15.5)
WBC: 4.4 10*3/uL (ref 4.0–10.5)

## 2018-02-15 MED ORDER — DEXTROSE 5 % IN LACTATED RINGERS IV BOLUS
1000.0000 mL | Freq: Once | INTRAVENOUS | Status: AC
  Administered 2018-02-15: 1000 mL via INTRAVENOUS

## 2018-02-15 MED ORDER — LOPERAMIDE HCL 2 MG PO CAPS
4.0000 mg | ORAL_CAPSULE | Freq: Once | ORAL | Status: AC
  Administered 2018-02-15: 4 mg via ORAL
  Filled 2018-02-15: qty 2

## 2018-02-15 MED ORDER — LOPERAMIDE HCL 2 MG PO CAPS: 2.0000 mg | ORAL_CAPSULE | Freq: Four times a day (QID) | ORAL | 0 refills | Status: DC | PRN

## 2018-02-15 NOTE — Discharge Instructions (Signed)
Food Choices to Help Relieve Diarrhea, Adult  When you have diarrhea, the foods you eat and your eating habits are very important. Choosing the right foods and drinks can help:  · Relieve diarrhea.  · Replace lost fluids and nutrients.  · Prevent dehydration.    What general guidelines should I follow?  Relieving diarrhea  · Choose foods with less than 2 g or .07 oz. of fiber per serving.  · Limit fats to less than 8 tsp (38 g or 1.34 oz.) a day.  · Avoid the following:  ? Foods and beverages sweetened with high-fructose corn syrup, honey, or sugar alcohols such as xylitol, sorbitol, and mannitol.  ? Foods that contain a lot of fat or sugar.  ? Fried, greasy, or spicy foods.  ? High-fiber grains, breads, and cereals.  ? Raw fruits and vegetables.  · Eat foods that are rich in probiotics. These foods include dairy products such as yogurt and fermented milk products. They help increase healthy bacteria in the stomach and intestines (gastrointestinal tract, or GI tract).  · If you have lactose intolerance, avoid dairy products. These may make your diarrhea worse.  · Take medicine to help stop diarrhea (antidiarrheal medicine) only as told by your health care provider.  Replacing nutrients  · Eat small meals or snacks every 3–4 hours.  · Eat bland foods, such as white rice, toast, or baked potato, until your diarrhea starts to get better. Gradually reintroduce nutrient-rich foods as tolerated or as told by your health care provider. This includes:  ? Well-cooked protein foods.  ? Peeled, seeded, and soft-cooked fruits and vegetables.  ? Low-fat dairy products.  · Take vitamin and mineral supplements as told by your health care provider.  Preventing dehydration    · Start by sipping water or a special solution to prevent dehydration (oral rehydration solution, ORS). Urine that is clear or pale yellow means that you are getting enough fluid.  · Try to drink at least 8–10 cups of fluid each day to help replace lost  fluids.  · You may add other liquids in addition to water, such as clear juice or decaffeinated sports drinks, as tolerated or as told by your health care provider.  · Avoid drinks with caffeine, such as coffee, tea, or soft drinks.  · Avoid alcohol.  What foods are recommended?  The items listed may not be a complete list. Talk with your health care provider about what dietary choices are best for you.  Grains  White rice. White, French, or pita breads (fresh or toasted), including plain rolls, buns, or bagels. White pasta. Saltine, soda, or graham crackers. Pretzels. Low-fiber cereal. Cooked cereals made with water (such as cornmeal, farina, or cream cereals). Plain muffins. Matzo. Melba toast. Zwieback.  Vegetables  Potatoes (without the skin). Most well-cooked and canned vegetables without skins or seeds. Tender lettuce.  Fruits  Apple sauce. Fruits canned in juice. Cooked apricots, cherries, grapefruit, peaches, pears, or plums. Fresh bananas and cantaloupe.  Meats and other protein foods  Baked or boiled chicken. Eggs. Tofu. Fish. Seafood. Smooth nut butters. Ground or well-cooked tender beef, ham, veal, lamb, pork, or poultry.  Dairy  Plain yogurt, kefir, and unsweetened liquid yogurt. Lactose-free milk, buttermilk, skim milk, or soy milk. Low-fat or nonfat hard cheese.  Beverages  Water. Low-calorie sports drinks. Fruit juices without pulp. Strained tomato and vegetable juices. Decaffeinated teas. Sugar-free beverages not sweetened with sugar alcohols. Oral rehydration solutions, if approved by your health care   provider.  Seasoning and other foods  Bouillon, broth, or soups made from recommended foods.  What foods are not recommended?  The items listed may not be a complete list. Talk with your health care provider about what dietary choices are best for you.  Grains  Whole grain, whole wheat, bran, or rye breads, rolls, pastas, and crackers. Wild or brown rice. Whole grain or bran cereals. Barley. Oats  and oatmeal. Corn tortillas or taco shells. Granola. Popcorn.  Vegetables  Raw vegetables. Fried vegetables. Cabbage, broccoli, Brussels sprouts, artichokes, baked beans, beet greens, corn, kale, legumes, peas, sweet potatoes, and yams. Potato skins. Cooked spinach and cabbage.  Fruits  Dried fruit, including raisins and dates. Raw fruits. Stewed or dried prunes. Canned fruits with syrup.  Meat and other protein foods  Fried or fatty meats. Deli meats. Chunky nut butters. Nuts and seeds. Beans and lentils. Bacon. Hot dogs. Sausage.  Dairy  High-fat cheeses. Whole milk, chocolate milk, and beverages made with milk, such as milk shakes. Half-and-half. Cream. sour cream. Ice cream.  Beverages  Caffeinated beverages (such as coffee, tea, soda, or energy drinks). Alcoholic beverages. Fruit juices with pulp. Prune juice. Soft drinks sweetened with high-fructose corn syrup or sugar alcohols. High-calorie sports drinks.  Fats and oils  Butter. Cream sauces. Margarine. Salad oils. Plain salad dressings. Olives. Avocados. Mayonnaise.  Sweets and desserts  Sweet rolls, doughnuts, and sweet breads. Sugar-free desserts sweetened with sugar alcohols such as xylitol and sorbitol.  Seasoning and other foods  Honey. Hot sauce. Chili powder. Gravy. Cream-based or milk-based soups. Pancakes and waffles.  Summary  · When you have diarrhea, the foods you eat and your eating habits are very important.  · Make sure you get at least 8–10 cups of fluid each day, or enough to keep your urine clear or pale yellow.  · Eat bland foods and gradually reintroduce healthy, nutrient-rich foods as tolerated, or as told by your health care provider.  · Avoid high-fiber, fried, greasy, or spicy foods.  This information is not intended to replace advice given to you by your health care provider. Make sure you discuss any questions you have with your health care provider.  Document Released: 11/23/2003 Document Revised: 08/30/2016 Document  Reviewed: 08/30/2016  Elsevier Interactive Patient Education © 2018 [employer]

## 2018-02-15 NOTE — MAU Provider Note (Signed)
Chief Complaint: Diarrhea   First Provider Initiated Contact with Patient 02/15/18 0034      SUBJECTIVE HPI: Andrea [name] is a 37 y.o. G2P1001 at [redacted]w[redacted]d who presents to Maternity Admissions reporting watery, non-bloody diarrhea x4 days and headache, dizziness and lightheadedness x1 day.  Diarrhea started 02/11/2018.  Occurred approximately 4 times in the first day, 7 times yesterday.  Is able to eat but is mostly just drinking water now because eating food stimulates diarrhea.  Duration: 4 days Course: Worsening Modifying factors: Aggravated by eating and drinking.  Associated signs and symptoms: Negative for fever, chills, vomiting, sick contacts  Past Medical History:  Diagnosis Date  . ADHD   . 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 BREAST SURGERY    . CESAREAN SECTION  10/2014  . LAPAROSCOPIC APPENDECTOMY N/A 07/15/2017   Procedure: APPENDECTOMY LAPAROSCOPIC;  Surgeon: Jackolyn Confer, MD;  Location: [facility];  Service: General;  Laterality: N/A;   Social History   Socioeconomic History  . Marital status: Married    Spouse name: Not on file  . Number of children: Not on file  . Years of education: Not on file  . Highest education level: Not on file  Occupational History  . Not on file  Social Needs  . Financial resource strain: Not on file  . Food insecurity:    Worry: Not on file    Inability: Not on file  . Transportation needs:    Medical: Not on file    Non-medical: Not on file  Tobacco Use  . Smoking status: Never Smoker  . Smokeless tobacco: Never Used  Substance and Sexual Activity  . Alcohol use: Not Currently    Comment: wine, weekly  . Drug use: No  . Sexual activity:  Yes    Birth control/protection: None  Lifestyle  . Physical activity:    Days per week: Not on file    Minutes per session: Not on file  . Stress: Not on file  Relationships  . Social connections:    Talks on phone: Not on file    Gets together: Not on file    Attends religious service: Not on file    Active member of club or organization: Not on file    Attends meetings of clubs or organizations: Not on file    Relationship status: Not on file  . Intimate partner violence:    Fear of current or ex partner: Not on file    Emotionally abused: Not on file    Physically abused: Not on file    Forced sexual activity: Not on file  Other Topics Concern  . Not on file  Social History Narrative  . Not on file   Family History  Problem Relation Age of Onset  . Cancer Maternal Grandmother        Uterine Cancer  . Healthy Mother   . Healthy Father    No current facility-administered medications on file prior to encounter.    No current  outpatient medications on file prior to encounter.   Allergies  Allergen Reactions  . Lamictal [Lamotrigine] Other (See Comments)    STS syndrome    I have reviewed patient's Past Medical Hx, Surgical Hx, Family Hx, Social Hx, medications and allergies.   Review of Systems  Constitutional: Negative for appetite change, chills and fever.  Gastrointestinal: Positive for abdominal pain (Generalized cramping right before having diarrhea) and diarrhea. Negative for abdominal distention, blood in stool, constipation, nausea and vomiting.  Genitourinary: Negative for vaginal bleeding.  Neurological: Positive for dizziness, weakness, light-headedness and headaches. Negative for syncope and speech difficulty.    OBJECTIVE Patient Vitals for the past 24 hrs:  BP Temp Pulse Resp  02/14/18 2314 128/74 98.1 F (36.7 C) 67 18   Constitutional: Well-developed, well-nourished female in no acute distress.  Head: Mucous membranes moist Cardiovascular:  normal rate Respiratory: normal rate and effort.  GI: Abd soft, non-tender, gravid appropriate for gestational age. Pos BS x 4 Neurologic: Alert and oriented x 4.  GU: Deferred  Fetal heart rate 150 by informal bedside ultrasound.  LAB RESULTS Results for orders placed or performed during the hospital encounter of 02/14/18 (from the past 24 hour(s))  Urinalysis, Routine w reflex microscopic     Status: Abnormal   Collection Time: 02/14/18 10:55 PM  Result Value Ref Range   Color, Urine AMBER (A) YELLOW   APPearance HAZY (A) CLEAR   Specific Gravity, Urine 1.031 (H) 1.005 - 1.030   pH 5.0 5.0 - 8.0   Glucose, UA NEGATIVE NEGATIVE mg/dL   Hgb urine dipstick NEGATIVE NEGATIVE   Bilirubin Urine NEGATIVE NEGATIVE   Ketones, ur 5 (A) NEGATIVE mg/dL   Protein, ur NEGATIVE NEGATIVE mg/dL   Nitrite NEGATIVE NEGATIVE   Leukocytes, UA NEGATIVE NEGATIVE  CBC with Differential/Platelet     Status: Abnormal   Collection Time: 02/15/18 12:19 AM  Result Value Ref Range   WBC 4.4 4.0 - 10.5 K/uL   RBC 3.62 (L) 3.87 - 5.11 MIL/uL   Hemoglobin 11.5 (L) 12.0 - 15.0 g/dL   HCT 34.0 (L) 36.0 - 46.0 %   MCV 93.9 78.0 - 100.0 fL   MCH 31.8 26.0 - 34.0 pg   MCHC 33.8 30.0 - 36.0 g/dL   RDW 12.6 11.5 - 15.5 %   Platelets 223 150 - 400 K/uL   Neutrophils Relative % 45 %   Neutro Abs 2.0 1.7 - 7.7 K/uL   Lymphocytes Relative 49 %   Lymphs Abs 2.2 0.7 - 4.0 K/uL   Monocytes Relative 4 %   Monocytes Absolute 0.2 0.1 - 1.0 K/uL   Eosinophils Relative 1 %   Eosinophils Absolute 0.1 0.0 - 0.7 K/uL   Basophils Relative 1 %   Basophils Absolute 0.0 0.0 - 0.1 K/uL  Comprehensive metabolic panel     Status: Abnormal   Collection Time: 02/15/18  1:10 AM  Result Value Ref Range   Sodium 137 135 - 145 mmol/L   Potassium 4.6 3.5 - 5.1 mmol/L   Chloride 108 101 - 111 mmol/L   CO2 20 (L) 22 - 32 mmol/L   Glucose, Bld 95 65 - 99 mg/dL   BUN 6 6 - 20 mg/dL   Creatinine, Ser 0.37 (L) 0.44 - 1.00 mg/dL    Calcium 9.1 8.9 - 10.3 mg/dL   Total Protein 6.8 6.5 - 8.1 g/dL   Albumin 3.6 3.5 - 5.0 g/dL   AST 16 15 - 41 U/L   ALT 22  14 - 54 U/L   Alkaline Phosphatase 48 38 - 126 U/L   Total Bilirubin 1.0 0.3 - 1.2 mg/dL   GFR calc non Af Amer >60 >60 mL/min   GFR calc Af Amer >60 >60 mL/min   Anion gap 9 5 - 15    IMAGING No results found.  MAU COURSE Orders Placed This Encounter  Procedures  . Urinalysis, Routine w reflex microscopic  . CBC with Differential/Platelet  . Comprehensive metabolic panel  . Enteric precautions (UV disinfection) C difficile, Norovirus  . Insert peripheral IV  . Discharge patient   Meds ordered this encounter  Medications  . lactated ringers bolus 1,000 mL  . loperamide (IMODIUM) capsule 4 mg  . dextrose 5% lactated ringers bolus 1,000 mL  . loperamide (IMODIUM) 2 MG capsule    Sig: Take 1 capsule (2 mg total) by mouth 4 (four) times daily as needed for diarrhea or loose stools.    Dispense:  12 capsule    Refill:  0    Order Specific Question:   Supervising Provider    Answer:   CONSTANT, PEGGY [[id number]]   Feeling much better after IV fluids.  Headache, dizziness, lightheadedness resolved.  No diarrhea while on maternity admissions.  Unable to collect stool culture.  Discussed Hx, labs, exam w/ Dr. Corinna Capra. Agrees w/ POC. New orders: Continue Imodium.   MDM -Diarrhea x4 days, most likely viral but stool cultures and testing for C. difficile should be considered if symptoms are not resolving in the next few days.  No risk factors for C. difficile or food-borne diarrheal illness.  ASSESSMENT 1. Diarrhea of presumed infectious origin   2. Pregnant state, incidental     PLAN Discharge home in stable condition. Imodium as needed. Drink small amounts frequently and advance diet slowly with binding foods.  List given.  Recommend drinks with electrolytes instead of only water.  Follow-up Information    Dian Queen, MD Follow up on 02/19/2018.    Specialty:  Obstetrics and Gynecology Why:  As scheduled or sooner as needed if symptoms worsen Contact information: [address] [place] [address] [place] Follow up.   Why:  As needed in pregnancy emergencies Contact information: [address] [id number] [place] [phone]         Allergies as of 02/15/2018      Reactions   Lamictal [lamotrigine] Other (See Comments)   STS syndrome      Medication List    TAKE these medications   loperamide 2 MG capsule Commonly known as:  IMODIUM Take 1 capsule (2 mg total) by mouth 4 (four) times daily as needed for diarrhea or loose stools.        Tamala Julian, [place], [place] 02/15/2018  3:03 AM

## 2018-02-19 DIAGNOSIS — Z3A13 13 weeks gestation of pregnancy: Secondary | ICD-10-CM | POA: Diagnosis not present

## 2018-02-19 DIAGNOSIS — Z3682 Encounter for antenatal screening for nuchal translucency: Secondary | ICD-10-CM | POA: Diagnosis not present

## 2018-04-22 DIAGNOSIS — Z363 Encounter for antenatal screening for malformations: Secondary | ICD-10-CM | POA: Diagnosis not present

## 2018-04-22 DIAGNOSIS — Z3A21 21 weeks gestation of pregnancy: Secondary | ICD-10-CM | POA: Diagnosis not present

## 2018-05-20 DIAGNOSIS — Z3480 Encounter for supervision of other normal pregnancy, unspecified trimester: Secondary | ICD-10-CM | POA: Diagnosis not present

## 2018-05-20 DIAGNOSIS — Z362 Encounter for other antenatal screening follow-up: Secondary | ICD-10-CM | POA: Diagnosis not present

## 2018-05-20 DIAGNOSIS — Z23 Encounter for immunization: Secondary | ICD-10-CM | POA: Diagnosis not present

## 2018-05-20 DIAGNOSIS — Z3A25 25 weeks gestation of pregnancy: Secondary | ICD-10-CM | POA: Diagnosis not present

## 2018-06-03 DIAGNOSIS — D649 Anemia, unspecified: Secondary | ICD-10-CM | POA: Diagnosis not present

## 2018-06-19 DIAGNOSIS — Z3480 Encounter for supervision of other normal pregnancy, unspecified trimester: Secondary | ICD-10-CM | POA: Diagnosis not present

## 2018-07-03 DIAGNOSIS — Z23 Encounter for immunization: Secondary | ICD-10-CM | POA: Diagnosis not present

## 2018-07-28 DIAGNOSIS — Z3480 Encounter for supervision of other normal pregnancy, unspecified trimester: Secondary | ICD-10-CM | POA: Diagnosis not present

## 2018-07-28 DIAGNOSIS — Z3685 Encounter for antenatal screening for Streptococcus B: Secondary | ICD-10-CM | POA: Diagnosis not present

## 2018-08-03 ENCOUNTER — Encounter (HOSPITAL_COMMUNITY): Payer: Self-pay

## 2018-08-05 ENCOUNTER — Telehealth (HOSPITAL_COMMUNITY): Payer: Self-pay | Admitting: *Deleted

## 2018-08-05 NOTE — Telephone Encounter (Signed)
Preadmission screen  

## 2018-08-06 ENCOUNTER — Other Ambulatory Visit (HOSPITAL_COMMUNITY): Payer: Self-pay | Admitting: *Deleted

## 2018-08-06 ENCOUNTER — Encounter (HOSPITAL_COMMUNITY): Payer: Self-pay

## 2018-08-07 ENCOUNTER — Ambulatory Visit (HOSPITAL_COMMUNITY)
Admission: RE | Admit: 2018-08-07 | Discharge: 2018-08-07 | Disposition: A | Discharge status: Home / Self Care | Payer: BLUE CROSS/BLUE SHIELD | Source: Ambulatory Visit | Attending: Obstetrics and Gynecology | Admitting: Obstetrics and Gynecology

## 2018-08-07 ENCOUNTER — Other Ambulatory Visit: Payer: Self-pay

## 2018-08-07 ENCOUNTER — Inpatient Hospital Stay (HOSPITAL_COMMUNITY)
Admission: AD | Admit: 2018-08-07 | Discharge: 2018-08-07 | Disposition: A | Discharge status: Home / Self Care | Payer: BLUE CROSS/BLUE SHIELD | Source: Ambulatory Visit | Attending: Obstetrics and Gynecology | Admitting: Obstetrics and Gynecology

## 2018-08-07 DIAGNOSIS — O99019 Anemia complicating pregnancy, unspecified trimester: Secondary | ICD-10-CM | POA: Insufficient documentation

## 2018-08-07 DIAGNOSIS — D6489 Other specified anemias: Secondary | ICD-10-CM | POA: Diagnosis not present

## 2018-08-07 DIAGNOSIS — Z8249 Family history of ischemic heart disease and other diseases of the circulatory system: Secondary | ICD-10-CM | POA: Insufficient documentation

## 2018-08-07 DIAGNOSIS — Z3A00 Weeks of gestation of pregnancy not specified: Secondary | ICD-10-CM | POA: Insufficient documentation

## 2018-08-07 DIAGNOSIS — O99013 Anemia complicating pregnancy, third trimester: Secondary | ICD-10-CM | POA: Diagnosis present

## 2018-08-07 LAB — CBC WITH DIFFERENTIAL/PLATELET
BASOS ABS: 0 10*3/uL (ref 0.0–0.1)
BASOS ABS: 0 10*3/uL (ref 0.0–0.1)
BASOS PCT: 1 %
Basophils Relative: 0 %
EOS ABS: 0.1 10*3/uL (ref 0.0–0.5)
Eosinophils Absolute: 0.1 10*3/uL (ref 0.0–0.5)
Eosinophils Relative: 1 %
Eosinophils Relative: 1 %
HCT: 23.5 % — ABNORMAL LOW (ref 36.0–46.0)
HEMATOCRIT: 27.4 % — AB (ref 36.0–46.0)
HEMOGLOBIN: 9 g/dL — AB (ref 12.0–15.0)
Hemoglobin: 7.7 g/dL — ABNORMAL LOW (ref 12.0–15.0)
LYMPHS ABS: 2.5 10*3/uL (ref 0.7–4.0)
LYMPHS ABS: 2.9 10*3/uL (ref 0.7–4.0)
Lymphocytes Relative: 31 %
Lymphocytes Relative: 44 %
MCH: 34.5 pg — ABNORMAL HIGH (ref 26.0–34.0)
MCH: 33.1 pg (ref 26.0–34.0)
MCHC: 32.8 g/dL (ref 30.0–36.0)
MCHC: 32.8 g/dL (ref 30.0–36.0)
MCV: 105.4 fL — AB (ref 80.0–100.0)
MCV: 100.7 fL — ABNORMAL HIGH (ref 80.0–100.0)
MONOS PCT: 3 %
MONOS PCT: 4 %
Monocytes Absolute: 0.2 10*3/uL (ref 0.1–1.0)
Monocytes Absolute: 0.2 10*3/uL (ref 0.1–1.0)
NEUTROS ABS: 3.3 10*3/uL (ref 1.7–7.7)
NEUTROS PCT: 65 %
NEUTROS PCT: 50 %
NRBC: 0.6 % — AB (ref 0.0–0.2)
Neutro Abs: 5.1 10*3/uL (ref 1.7–7.7)
Other: 0 %
PLATELETS: 258 10*3/uL (ref 150–400)
PLATELETS: 235 10*3/uL (ref 150–400)
RBC: 2.23 MIL/uL — AB (ref 3.87–5.11)
RBC: 2.72 MIL/uL — ABNORMAL LOW (ref 3.87–5.11)
RDW: 16.1 % — ABNORMAL HIGH (ref 11.5–15.5)
RDW: 17.9 % — ABNORMAL HIGH (ref 11.5–15.5)
WBC: 7.9 10*3/uL (ref 4.0–10.5)
WBC: 6.5 10*3/uL (ref 4.0–10.5)
nRBC: 1.2 % — ABNORMAL HIGH (ref 0.0–0.2)

## 2018-08-07 LAB — ABO/RH: ABO/RH(D): O POS

## 2018-08-07 LAB — PREPARE RBC (CROSSMATCH)

## 2018-08-07 MED ORDER — DIPHENHYDRAMINE HCL 50 MG/ML IJ SOLN
INTRAMUSCULAR | Status: AC
  Administered 2018-08-07: 25 mg
  Filled 2018-08-07: qty 1

## 2018-08-07 MED ORDER — SODIUM CHLORIDE 0.9% IV SOLUTION
Freq: Once | INTRAVENOUS | Status: AC
  Administered 2018-08-07: 14:00:00 via INTRAVENOUS

## 2018-08-07 MED ORDER — ACETAMINOPHEN 325 MG PO TABS
650.0000 mg | ORAL_TABLET | Freq: Once | ORAL | Status: AC
  Administered 2018-08-07: 650 mg via ORAL
  Filled 2018-08-07: qty 2

## 2018-08-07 MED ORDER — DIPHENHYDRAMINE HCL 50 MG/ML IJ SOLN: 25.0000 mg | Freq: Once | INTRAMUSCULAR | Status: DC

## 2018-08-07 MED ORDER — SODIUM CHLORIDE 0.9 % IV SOLN
510.0000 mg | INTRAVENOUS | Status: DC
  Administered 2018-08-07: 510 mg via INTRAVENOUS
  Filled 2018-08-07: qty 17

## 2018-08-07 NOTE — Discharge Instructions (Signed)

## 2018-08-07 NOTE — MAU Note (Signed)
[facility] from [facility] after reaction to fereheme. Short stay Dr. Hulen Skains Dr. Helane Rima and was sent to MAU for blood transfusion. Dr. Helane Rima advised to call Dr. Gaetano Net, Dr. Gaetano Net called and advised we are giving two units of blood, monitor baby and the plan is for patient to go home after.

## 2018-08-07 NOTE — Progress Notes (Addendum)
0920 Patient had reaction to feraheme.  C/o Back pain, headache. IV feraheme was stopped immediately and 250 ml ns given.  Dr. Sung Amabile came to see patient and gave order to give benadryl 25 mg IV.  Dr. Sung Amabile also called and spoke to Dr. Runell Gess and patient was instructed to go directly to [facility] hospital.  Patient's [facility] was driving [facility].  Vital signs stable 125/80, 72, 100 o2 sat on RA.  Fetal heart rate prior to discharge was 127.  Dr. Sung Amabile took  patient out via wheelchair.  Per Dr. Sung Amabile we should leave IV in place.  Patient stated she felt much better prior to leaving.

## 2018-08-07 NOTE — H&P (Signed)
Andrea [name] is a 37 y.o. G 2 P 1 at 39 weeks presents for Repeat LTCS.  OB History    Gravida  2   Para  1   Term  1   Preterm      AB      Living  1     SAB      TAB      Ectopic      Multiple      Live Births  1          Past Medical History:  Diagnosis Date  . ADHD   . Anemia   . Hypomanic bipolar II disorder [facility]    Past Surgical History:  Procedure Laterality Date  . APPENDECTOMY    . BREAST SURGERY    . CESAREAN SECTION  10/2014  . LAPAROSCOPIC APPENDECTOMY N/A 07/15/2017   Procedure: APPENDECTOMY LAPAROSCOPIC;  Surgeon: Jackolyn Confer, MD;  Location: [facility];  Service: General;  Laterality: N/A;  . WISDOM TOOTH EXTRACTION     Family History: family history includes Cancer in her maternal grandmother; Healthy in her father and mother; Heart disease in her maternal grandfather. Social History:  reports that she has never smoked. She has never used smokeless tobacco. She reports that she drank alcohol. She reports that she does not use drugs.     Maternal Diabetes: No Genetic Screening: Normal Maternal Ultrasounds/Referrals: Normal Fetal Ultrasounds or other Referrals:  None Maternal Substance Abuse:  No Significant Maternal Medications:  None Significant Maternal Lab Results:  None Other Comments:  None  Review of Systems  All other systems reviewed and are negative.  History   There were no vitals taken for this visit. Maternal Exam:  Abdomen: Fetal presentation: vertex     Fetal Exam Fetal State Assessment: Category I - tracings are normal.     Physical Exam  Nursing note and vitals reviewed. Constitutional: She appears well-developed and well-nourished.  HENT:  Head: Normocephalic.  Eyes: Pupils are equal, round, and reactive to light.  Neck: Normal range of motion.  Cardiovascular: Normal rate and regular rhythm.  Respiratory: Effort normal.  GI: Soft.    Prenatal labs: ABO, Rh: O/Positive/-- (05/02 0000) Antibody:    Rubella: Immune (05/02 0000) RPR: Nonreactive (05/02 0000)  HBsAg: Negative (05/02 0000)  HIV: Non-reactive (05/02 0000)  GBS:     Assessment/Plan: IUP at term Previous Cesarean Section Repeat LTCS Risks reviewed Consent signed  Andrea [name] L 08/07/2018, 7:12 AM

## 2018-08-07 NOTE — Discharge Summary (Signed)
Physician Discharge Summary  Patient ID: Andrea [name] MRN: [id number] DOB/AGE: 1981-03-22 37 y.o.  Admit date: 08/07/2018 Discharge date: 08/07/2018  Admission Diagnoses:anemia of pregnancy  Discharge Diagnoses:  Active Problems:   Anemia in pregnancy, third trimester   Discharged Condition: good  Hospital Course: admitted for transfusion of PRBC. Tolerated transfusion well.  Consults: None  Significant Diagnostic Studies: labs:  Results for orders placed or performed during the hospital encounter of 08/07/18 (from the past 24 hour(s))  Type and screen [place]     Status: None (Preliminary result)   Collection Time: 08/07/18 11:35 AM  Result Value Ref Range   ABO/RH(D) O POS    Antibody Screen NEG    Sample Expiration 08/10/2018    Unit Number [id number]    Blood Component Type RED CELLS,LR    Unit division 00    Status of Unit ISSUED    Transfusion Status OK TO TRANSFUSE    Crossmatch Result Compatible    Unit Number [id number]    Blood Component Type RED CELLS,LR    Unit division 00    Status of Unit ISSUED    Transfusion Status OK TO TRANSFUSE    Crossmatch Result      Compatible Performed at [facility], [address] [place], [place] [address]   ABO/Rh     Status: None   Collection Time: 08/07/18 11:40 AM  Result Value Ref Range   ABO/RH(D)      O POS Performed at [facility], [address] [place], [place] [address]   CBC with Differential/Platelet     Status: Abnormal   Collection Time: 08/07/18 11:43 AM  Result Value Ref Range   WBC 7.9 4.0 - 10.5 K/uL   RBC 2.23 (L) 3.87 - 5.11 MIL/uL   Hemoglobin 7.7 (L) 12.0 - 15.0 g/dL   HCT 23.5 (L) 36.0 - 46.0 %   MCV 105.4 (H) 80.0 - 100.0 fL   MCH 34.5 (H) 26.0 - 34.0 pg   MCHC 32.8 30.0 - 36.0 g/dL   RDW 16.1 (H) 11.5 - 15.5 %   Platelets 258 150 - 400 K/uL   nRBC 0.6 (H) 0.0 - 0.2 %   Neutrophils Relative % 65 %   Lymphocytes Relative 31 %   Monocytes  Relative 3 %   Eosinophils Relative 1 %   Basophils Relative 0 %   Other 0 %   Neutro Abs 5.1 1.7 - 7.7 K/uL   Lymphs Abs 2.5 0.7 - 4.0 K/uL   Monocytes Absolute 0.2 0.1 - 1.0 K/uL   Eosinophils Absolute 0.1 0.0 - 0.5 K/uL   Basophils Absolute 0.0 0.0 - 0.1 K/uL  Prepare RBC     Status: None   Collection Time: 08/07/18 11:55 AM  Result Value Ref Range   Order Confirmation      ORDER PROCESSED BY BLOOD BANK Performed at [facility], [address] [place], [place] [address]   CBC with Differential/Platelet     Status: Abnormal   Collection Time: 08/07/18  8:59 PM  Result Value Ref Range   WBC 6.5 4.0 - 10.5 K/uL   RBC 2.72 (L) 3.87 - 5.11 MIL/uL   Hemoglobin 9.0 (L) 12.0 - 15.0 g/dL   HCT 27.4 (L) 36.0 - 46.0 %   MCV 100.7 (H) 80.0 - 100.0 fL   MCH 33.1 26.0 - 34.0 pg   MCHC 32.8 30.0 - 36.0 g/dL   RDW 17.9 (H) 11.5 - 15.5 %   Platelets  235 150 - 400 K/uL   nRBC 1.2 (H) 0.0 - 0.2 %   Neutrophils Relative % 50 %   Neutro Abs 3.3 1.7 - 7.7 K/uL   Lymphocytes Relative 44 %   Lymphs Abs 2.9 0.7 - 4.0 K/uL   Monocytes Relative 4 %   Monocytes Absolute 0.2 0.1 - 1.0 K/uL   Eosinophils Relative 1 %   Eosinophils Absolute 0.1 0.0 - 0.5 K/uL   Basophils Relative 1 %   Basophils Absolute 0.0 0.0 - 0.1 K/uL    Treatments: transfusion 2 units PRBC  Discharge Exam: Blood pressure (!) 107/53, pulse 67, temperature 97.6 F (36.4 C), temperature source Oral, resp. rate 16, SpO2 98 %. General appearance: alert, cooperative and no distress  Disposition: Discharge disposition: 01-Home or Self Care        Allergies as of 08/07/2018      Reactions   Feraheme [ferumoxytol] Anaphylaxis   Chest pain during infusion   Lamictal [lamotrigine] Other (See Comments)   SJS       Medication List    STOP taking these medications   loperamide 2 MG capsule Commonly known as:  IMODIUM     TAKE these medications   ferrous sulfate 325 (65 FE) MG tablet Take 325 mg by mouth  daily with breakfast.   multivitamin-prenatal 27-0.8 MG Tabs tablet Take 1 tablet by mouth daily at 12 noon.        Signed: Shon Millet II 08/07/2018, 9:28 PM

## 2018-08-07 NOTE — MAU Note (Signed)
[facility] available to take patient for OBS and transfusion. Pt transferred to [facility]. VS stable. FHR WNL.

## 2018-08-07 NOTE — H&P (Signed)
Andrea [name] is a 37 y.o. female presenting for transfusion. Scheduled for repeat C/S. Cap hgb in office=6.8/7.1. IV Ferraheme infusion started this am. She had reaction within 30 seconds of burning pain from head to foot. Now has some low back pain but otherwise doing better. No abdominal pain, no bleeding, no leaking. Has been taking oral iron with no improvement in Hgb. With reaction the Marisue [place] was stopped and she was given IV Benadryl 25mg . OB History    Gravida  2   Para  1   Term  1   Preterm      AB      Living  1     SAB      TAB      Ectopic      Multiple      Live Births  1          Past Medical History:  Diagnosis Date  . ADHD   . Anemia   . Hypomanic bipolar II disorder [name])    Past Surgical History:  Procedure Laterality Date  . APPENDECTOMY    . BREAST SURGERY    . CESAREAN SECTION  10/2014  . LAPAROSCOPIC APPENDECTOMY N/A 07/15/2017   Procedure: APPENDECTOMY LAPAROSCOPIC;  Surgeon: Jackolyn Confer, MD;  Location: [facility];  Service: General;  Laterality: N/A;  . WISDOM TOOTH EXTRACTION     Family History: family history includes Cancer in her maternal grandmother; Healthy in her father and mother; Heart disease in her maternal grandfather. Social History:  reports that she has never smoked. She has never used smokeless tobacco. She reports that she drank alcohol. She reports that she does not use drugs.     Maternal Diabetes: No Genetic Screening: Normal Maternal Ultrasounds/Referrals: Normal Fetal Ultrasounds or other Referrals:  None Maternal Substance Abuse:  No Significant Maternal Medications:  None Significant Maternal Lab Results:  None Other Comments:  None  Review of Systems  Eyes: Negative for blurred vision.  Gastrointestinal: Negative for abdominal pain.   Maternal Medical History:  Fetal activity: Perceived fetal activity is normal.        Blood pressure 115/81, pulse 73, temperature 98.8 F (37.1 C), temperature  source Oral, resp. rate 18, SpO2 100 %.   Fetal Exam Fetal State Assessment: Category I - tracings are normal.     Physical Exam  Cardiovascular: Normal rate and regular rhythm.  Respiratory: Effort normal and breath sounds normal.  GI: Soft.    Lips pale. Uterus soft, NT Back - mild tenderness over LS bilat NST reactive  Prenatal labs: ABO, Rh: O/Positive/-- (05/02 0000) Antibody:   Rubella: Immune (05/02 0000) RPR: Nonreactive (05/02 0000)  HBsAg: Negative (05/02 0000)  HIV: Non-reactive (05/02 0000)  GBS:     Assessment/Plan: 36 yo with symptomatic anemia and anticipated blood loss with upcoming C/S D/W patient transfusion of 2 units PRBC and risks including transfusion reaction, HIV/Hep  Will give Tylenol now, had benadryl after Owens Loffler for back  Shon Millet II 08/07/2018, 11:57 AM

## 2018-08-08 LAB — TYPE AND SCREEN
ABO/RH(D): O POS
Antibody Screen: NEGATIVE
Unit division: 0
Unit division: 0

## 2018-08-08 LAB — BPAM RBC
BLOOD PRODUCT EXPIRATION DATE: 201912202359
Blood Product Expiration Date: 201912122359
ISSUE DATE / TIME: 201911221558
ISSUE DATE / TIME: 201911221347
UNIT TYPE AND RH: 5100
Unit Type and Rh: 5100

## 2018-08-10 ENCOUNTER — Encounter (HOSPITAL_COMMUNITY): Payer: BLUE CROSS/BLUE SHIELD

## 2018-08-10 DIAGNOSIS — D649 Anemia, unspecified: Secondary | ICD-10-CM | POA: Diagnosis not present

## 2018-08-19 ENCOUNTER — Encounter (HOSPITAL_COMMUNITY)
Admission: RE | Admit: 2018-08-19 | Discharge: 2018-08-19 | Disposition: A | Discharge status: Home / Self Care | Payer: BLUE CROSS/BLUE SHIELD | Source: Ambulatory Visit | Attending: Obstetrics and Gynecology | Admitting: Obstetrics and Gynecology

## 2018-08-19 DIAGNOSIS — O9902 Anemia complicating childbirth: Secondary | ICD-10-CM | POA: Diagnosis not present

## 2018-08-19 DIAGNOSIS — Z3A39 39 weeks gestation of pregnancy: Secondary | ICD-10-CM | POA: Diagnosis not present

## 2018-08-19 DIAGNOSIS — R634 Abnormal weight loss: Secondary | ICD-10-CM | POA: Diagnosis not present

## 2018-08-19 DIAGNOSIS — O34211 Maternal care for low transverse scar from previous cesarean delivery: Secondary | ICD-10-CM | POA: Diagnosis not present

## 2018-08-19 DIAGNOSIS — D649 Anemia, unspecified: Secondary | ICD-10-CM | POA: Diagnosis not present

## 2018-08-19 HISTORY — DX: Anemia, unspecified: D64.9

## 2018-08-19 LAB — CBC
HEMATOCRIT: 29.7 % — AB (ref 36.0–46.0)
HEMOGLOBIN: 9.6 g/dL — AB (ref 12.0–15.0)
MCH: 34.9 pg — ABNORMAL HIGH (ref 26.0–34.0)
MCHC: 32.3 g/dL (ref 30.0–36.0)
MCV: 108 fL — ABNORMAL HIGH (ref 80.0–100.0)
Platelets: 288 10*3/uL (ref 150–400)
RBC: 2.75 MIL/uL — ABNORMAL LOW (ref 3.87–5.11)
RDW: 16 % — ABNORMAL HIGH (ref 11.5–15.5)
WBC: 9 10*3/uL (ref 4.0–10.5)
nRBC: 0 % (ref 0.0–0.2)

## 2018-08-19 NOTE — Patient Instructions (Signed)
Ziasia Lenoir  08/19/2018   Your procedure is scheduled on:  08/20/2018  Enter through the Main Entrance of [facility] at [place] up the phone at the desk and dial [phone]  Call this number if you have problems the morning of surgery:[phone]  Remember:   Do not eat food:(After Midnight) Desps de medianoche.  Do not drink clear liquids: (After Midnight) Desps de medianoche.  Take these medicines the morning of surgery with A SIP OF WATER: none   Do not wear jewelry, make-up or nail polish.  Do not wear lotions, powders, or perfumes. Do not wear deodorant.  Do not shave 48 hours prior to surgery.  Do not bring valuables to the hospital.  [facility] is not   responsible for any belongings or valuables brought to the hospital.  Contacts, dentures or bridgework may not be worn into surgery.  Leave suitcase in the car. After surgery it may be brought to your room.  For patients admitted to the hospital, checkout time is 11:00 AM the day of              discharge.    N/A   Please read over the following fact sheets that you were given:   Surgical Site Infection Prevention

## 2018-08-20 ENCOUNTER — Inpatient Hospital Stay (HOSPITAL_COMMUNITY): Payer: BLUE CROSS/BLUE SHIELD

## 2018-08-20 ENCOUNTER — Encounter (HOSPITAL_COMMUNITY): Payer: Self-pay | Admitting: *Deleted

## 2018-08-20 ENCOUNTER — Inpatient Hospital Stay (HOSPITAL_COMMUNITY)
Admission: RE | Admit: 2018-08-20 | Discharge: 2018-08-22 | DRG: 788 | Disposition: A | Discharge status: Home / Self Care | Payer: BLUE CROSS/BLUE SHIELD | Attending: Obstetrics and Gynecology | Admitting: Obstetrics and Gynecology

## 2018-08-20 ENCOUNTER — Encounter (HOSPITAL_COMMUNITY)
Admission: RE | Disposition: A | Discharge status: Home / Self Care | Payer: Self-pay | Source: Home / Self Care | Attending: Obstetrics and Gynecology

## 2018-08-20 DIAGNOSIS — O9902 Anemia complicating childbirth: Secondary | ICD-10-CM | POA: Diagnosis present

## 2018-08-20 DIAGNOSIS — Z3A39 39 weeks gestation of pregnancy: Secondary | ICD-10-CM

## 2018-08-20 DIAGNOSIS — O34211 Maternal care for low transverse scar from previous cesarean delivery: Principal | ICD-10-CM | POA: Diagnosis present

## 2018-08-20 DIAGNOSIS — D649 Anemia, unspecified: Secondary | ICD-10-CM | POA: Diagnosis present

## 2018-08-20 DIAGNOSIS — R634 Abnormal weight loss: Secondary | ICD-10-CM | POA: Diagnosis not present

## 2018-08-20 DIAGNOSIS — Z98891 History of uterine scar from previous surgery: Secondary | ICD-10-CM

## 2018-08-20 LAB — RPR: RPR: NONREACTIVE

## 2018-08-20 SURGERY — Surgical Case
Anesthesia: Spinal

## 2018-08-20 MED ORDER — OXYTOCIN 10 UNIT/ML IJ SOLN
INTRAVENOUS | Status: DC | PRN
  Administered 2018-08-20: 40 [IU] via INTRAVENOUS

## 2018-08-20 MED ORDER — HYDROMORPHONE HCL 1 MG/ML IJ SOLN: 0.2500 mg | INTRAMUSCULAR | Status: DC | PRN

## 2018-08-20 MED ORDER — OXYCODONE-ACETAMINOPHEN 5-325 MG PO TABS
1.0000 | ORAL_TABLET | ORAL | Status: DC | PRN
  Administered 2018-08-20 – 2018-08-21 (×2): 1 via ORAL
  Administered 2018-08-21 (×2): 2 via ORAL
  Administered 2018-08-21: 1 via ORAL
  Administered 2018-08-21: 2 via ORAL
  Administered 2018-08-22 (×2): 1 via ORAL
  Filled 2018-08-20 (×7): qty 1
  Filled 2018-08-20 (×2): qty 2

## 2018-08-20 MED ORDER — NALBUPHINE HCL 10 MG/ML IJ SOLN: 5.0000 mg | Freq: Once | INTRAMUSCULAR | Status: DC | PRN

## 2018-08-20 MED ORDER — OXYTOCIN 10 UNIT/ML IJ SOLN
INTRAMUSCULAR | Status: AC
  Filled 2018-08-20: qty 4

## 2018-08-20 MED ORDER — DEXAMETHASONE SODIUM PHOSPHATE 10 MG/ML IJ SOLN
INTRAMUSCULAR | Status: DC | PRN
  Administered 2018-08-20: 4 mg via INTRAVENOUS

## 2018-08-20 MED ORDER — OXYTOCIN 40 UNITS IN LACTATED RINGERS INFUSION - SIMPLE MED: 2.5000 [IU]/h | INTRAVENOUS | Status: AC

## 2018-08-20 MED ORDER — SIMETHICONE 80 MG PO CHEW
80.0000 mg | CHEWABLE_TABLET | ORAL | Status: DC
  Administered 2018-08-21 (×2): 80 mg via ORAL
  Filled 2018-08-20 (×2): qty 1

## 2018-08-20 MED ORDER — DIPHENHYDRAMINE HCL 50 MG/ML IJ SOLN: 12.5000 mg | INTRAMUSCULAR | Status: DC | PRN

## 2018-08-20 MED ORDER — TETANUS-DIPHTH-ACELL PERTUSSIS 5-2.5-18.5 LF-MCG/0.5 IM SUSP: 0.5000 mL | Freq: Once | INTRAMUSCULAR | Status: DC

## 2018-08-20 MED ORDER — NALOXONE HCL 4 MG/10ML IJ SOLN: 1.0000 ug/kg/h | INTRAVENOUS | Status: DC | PRN

## 2018-08-20 MED ORDER — MEPERIDINE HCL 25 MG/ML IJ SOLN: 6.2500 mg | INTRAMUSCULAR | Status: DC | PRN

## 2018-08-20 MED ORDER — IBUPROFEN 800 MG PO TABS
800.0000 mg | ORAL_TABLET | Freq: Three times a day (TID) | ORAL | Status: DC | PRN
  Administered 2018-08-20 – 2018-08-22 (×5): 800 mg via ORAL
  Filled 2018-08-20 (×5): qty 1

## 2018-08-20 MED ORDER — MORPHINE SULFATE (PF) 0.5 MG/ML IJ SOLN
INTRAMUSCULAR | Status: DC | PRN
  Administered 2018-08-20: .15 mg via INTRATHECAL

## 2018-08-20 MED ORDER — NALBUPHINE HCL 10 MG/ML IJ SOLN: 5.0000 mg | INTRAMUSCULAR | Status: DC | PRN

## 2018-08-20 MED ORDER — LACTATED RINGERS IV SOLN
INTRAVENOUS | Status: DC | PRN
  Administered 2018-08-20: 08:00:00 via INTRAVENOUS

## 2018-08-20 MED ORDER — DIPHENHYDRAMINE HCL 25 MG PO CAPS
25.0000 mg | ORAL_CAPSULE | ORAL | Status: DC | PRN
  Filled 2018-08-20: qty 1

## 2018-08-20 MED ORDER — WITCH HAZEL-GLYCERIN EX PADS: 1.0000 "application " | MEDICATED_PAD | CUTANEOUS | Status: DC | PRN

## 2018-08-20 MED ORDER — PHENYLEPHRINE 8 MG IN D5W 100 ML (0.08MG/ML) PREMIX OPTIME
INJECTION | INTRAVENOUS | Status: DC | PRN
  Administered 2018-08-20: 60 ug/min via INTRAVENOUS

## 2018-08-20 MED ORDER — LACTATED RINGERS IV SOLN
INTRAVENOUS | Status: DC
  Administered 2018-08-20 – 2018-08-21 (×2): via INTRAVENOUS

## 2018-08-20 MED ORDER — ZOLPIDEM TARTRATE 5 MG PO TABS: 5.0000 mg | ORAL_TABLET | Freq: Every evening | ORAL | Status: DC | PRN

## 2018-08-20 MED ORDER — DEXAMETHASONE SODIUM PHOSPHATE 4 MG/ML IJ SOLN
INTRAMUSCULAR | Status: AC
  Filled 2018-08-20: qty 1

## 2018-08-20 MED ORDER — SIMETHICONE 80 MG PO CHEW
80.0000 mg | CHEWABLE_TABLET | ORAL | Status: DC | PRN
  Administered 2018-08-22 (×2): 80 mg via ORAL
  Filled 2018-08-20: qty 1

## 2018-08-20 MED ORDER — DIBUCAINE 1 % RE OINT: 1.0000 "application " | TOPICAL_OINTMENT | RECTAL | Status: DC | PRN

## 2018-08-20 MED ORDER — BUPIVACAINE IN DEXTROSE 0.75-8.25 % IT SOLN
INTRATHECAL | Status: DC | PRN
  Administered 2018-08-20: 1.6 mL via INTRATHECAL

## 2018-08-20 MED ORDER — FENTANYL CITRATE (PF) 100 MCG/2ML IJ SOLN
INTRAMUSCULAR | Status: AC
  Filled 2018-08-20: qty 2

## 2018-08-20 MED ORDER — LACTATED RINGERS IV SOLN
INTRAVENOUS | Status: DC
  Administered 2018-08-20 (×2): via INTRAVENOUS

## 2018-08-20 MED ORDER — SODIUM CHLORIDE 0.9 % IR SOLN
Status: DC | PRN
  Administered 2018-08-20: 1

## 2018-08-20 MED ORDER — MENTHOL 3 MG MT LOZG: 1.0000 | LOZENGE | OROMUCOSAL | Status: DC | PRN

## 2018-08-20 MED ORDER — KETOROLAC TROMETHAMINE 30 MG/ML IJ SOLN
INTRAMUSCULAR | Status: AC
  Filled 2018-08-20: qty 1

## 2018-08-20 MED ORDER — SIMETHICONE 80 MG PO CHEW
80.0000 mg | CHEWABLE_TABLET | Freq: Three times a day (TID) | ORAL | Status: DC
  Administered 2018-08-20 – 2018-08-21 (×4): 80 mg via ORAL
  Filled 2018-08-20 (×5): qty 1

## 2018-08-20 MED ORDER — SODIUM CHLORIDE 0.9 % IV SOLN
2.0000 g | INTRAVENOUS | Status: AC
  Administered 2018-08-20: 2 g via INTRAVENOUS
  Filled 2018-08-20: qty 2

## 2018-08-20 MED ORDER — DIPHENHYDRAMINE HCL 25 MG PO CAPS: 25.0000 mg | ORAL_CAPSULE | Freq: Four times a day (QID) | ORAL | Status: DC | PRN

## 2018-08-20 MED ORDER — OXYCODONE HCL 5 MG PO TABS: 5.0000 mg | ORAL_TABLET | Freq: Once | ORAL | Status: DC | PRN

## 2018-08-20 MED ORDER — ONDANSETRON HCL 4 MG/2ML IJ SOLN
INTRAMUSCULAR | Status: AC
  Filled 2018-08-20: qty 2

## 2018-08-20 MED ORDER — SCOPOLAMINE 1 MG/3DAYS TD PT72
MEDICATED_PATCH | TRANSDERMAL | Status: AC
  Filled 2018-08-20: qty 1

## 2018-08-20 MED ORDER — SCOPOLAMINE 1 MG/3DAYS TD PT72
1.0000 | MEDICATED_PATCH | Freq: Once | TRANSDERMAL | Status: DC
  Administered 2018-08-20: 1.5 mg via TRANSDERMAL

## 2018-08-20 MED ORDER — COCONUT OIL OIL: 1.0000 "application " | TOPICAL_OIL | Status: DC | PRN

## 2018-08-20 MED ORDER — NALOXONE HCL 0.4 MG/ML IJ SOLN: 0.4000 mg | INTRAMUSCULAR | Status: DC | PRN

## 2018-08-20 MED ORDER — ONDANSETRON HCL 4 MG/2ML IJ SOLN
INTRAMUSCULAR | Status: DC | PRN
  Administered 2018-08-20: 4 mg via INTRAVENOUS

## 2018-08-20 MED ORDER — SODIUM CHLORIDE 0.9% FLUSH: 3.0000 mL | INTRAVENOUS | Status: DC | PRN

## 2018-08-20 MED ORDER — PRENATAL MULTIVITAMIN CH
1.0000 | ORAL_TABLET | Freq: Every day | ORAL | Status: DC
  Administered 2018-08-21: 1 via ORAL
  Filled 2018-08-20: qty 1

## 2018-08-20 MED ORDER — PROMETHAZINE HCL 25 MG/ML IJ SOLN: 6.2500 mg | INTRAMUSCULAR | Status: DC | PRN

## 2018-08-20 MED ORDER — MORPHINE SULFATE (PF) 0.5 MG/ML IJ SOLN
INTRAMUSCULAR | Status: AC
  Filled 2018-08-20: qty 10

## 2018-08-20 MED ORDER — FENTANYL CITRATE (PF) 100 MCG/2ML IJ SOLN
INTRAMUSCULAR | Status: DC | PRN
  Administered 2018-08-20: 15 ug via INTRATHECAL

## 2018-08-20 MED ORDER — SENNOSIDES-DOCUSATE SODIUM 8.6-50 MG PO TABS
2.0000 | ORAL_TABLET | ORAL | Status: DC
  Administered 2018-08-21 (×2): 2 via ORAL
  Filled 2018-08-20 (×2): qty 2

## 2018-08-20 MED ORDER — OXYCODONE HCL 5 MG/5ML PO SOLN: 5.0000 mg | Freq: Once | ORAL | Status: DC | PRN

## 2018-08-20 MED ORDER — ACETAMINOPHEN 325 MG PO TABS
650.0000 mg | ORAL_TABLET | ORAL | Status: DC | PRN
  Administered 2018-08-20 – 2018-08-22 (×4): 650 mg via ORAL
  Filled 2018-08-20 (×4): qty 2

## 2018-08-20 MED ORDER — ONDANSETRON HCL 4 MG/2ML IJ SOLN: 4.0000 mg | Freq: Three times a day (TID) | INTRAMUSCULAR | Status: DC | PRN

## 2018-08-20 MED ORDER — KETOROLAC TROMETHAMINE 30 MG/ML IJ SOLN
30.0000 mg | Freq: Once | INTRAMUSCULAR | Status: AC | PRN
  Administered 2018-08-20: 30 mg via INTRAVENOUS

## 2018-08-20 SURGICAL SUPPLY — 34 items
BARRIER ADHS 3X4 INTERCEED (GAUZE/BANDAGES/DRESSINGS) ×2 IMPLANT
BENZOIN TINCTURE PRP APPL 2/3 (GAUZE/BANDAGES/DRESSINGS) ×2 IMPLANT
CHLORAPREP W/TINT 26ML (MISCELLANEOUS) ×2 IMPLANT
CLAMP CORD UMBIL (MISCELLANEOUS) IMPLANT
CLOSURE STERI STRIP 1/2 X4 (GAUZE/BANDAGES/DRESSINGS) ×2 IMPLANT
CLOTH BEACON ORANGE TIMEOUT ST (SAFETY) ×2 IMPLANT
DRSG OPSITE POSTOP 4X10 (GAUZE/BANDAGES/DRESSINGS) ×2 IMPLANT
ELECT REM PT RETURN 9FT ADLT (ELECTROSURGICAL) ×2
ELECTRODE REM PT RTRN 9FT ADLT (ELECTROSURGICAL) ×1 IMPLANT
EXTRACTOR VACUUM M CUP 4 TUBE (SUCTIONS) IMPLANT
GLOVE BIO SURGEON STRL SZ 6.5 (GLOVE) ×2 IMPLANT
GLOVE BIOGEL PI IND STRL 7.0 (GLOVE) ×1 IMPLANT
GLOVE BIOGEL PI INDICATOR 7.0 (GLOVE) ×1
GOWN STRL REUS W/TWL LRG LVL3 (GOWN DISPOSABLE) ×4 IMPLANT
HEMOSTAT ARISTA ABSORB 3G PWDR (MISCELLANEOUS) ×2 IMPLANT
KIT ABG SYR 3ML LUER SLIP (SYRINGE) IMPLANT
NEEDLE HYPO 22GX1.5 SAFETY (NEEDLE) IMPLANT
NEEDLE HYPO 25X5/8 SAFETYGLIDE (NEEDLE) ×2 IMPLANT
NS IRRIG 1000ML POUR BTL (IV SOLUTION) ×2 IMPLANT
PACK C SECTION WH (CUSTOM PROCEDURE TRAY) ×2 IMPLANT
PAD OB MATERNITY 4.3X12.25 (PERSONAL CARE ITEMS) ×2 IMPLANT
PENCIL SMOKE EVAC W/HOLSTER (ELECTROSURGICAL) ×2 IMPLANT
SPONGE LAP 18X18 RF (DISPOSABLE) ×6 IMPLANT
SUT CHROMIC 0 CTX 36 (SUTURE) ×4 IMPLANT
SUT PLAIN 0 NONE (SUTURE) IMPLANT
SUT PLAIN 2 0 XLH (SUTURE) IMPLANT
SUT VIC AB 0 CT1 27 (SUTURE) ×5
SUT VIC AB 0 CT1 27XBRD ANBCTR (SUTURE) ×5 IMPLANT
SUT VIC AB 0 CTX 36 (SUTURE) ×1
SUT VIC AB 0 CTX36XBRD ANBCTRL (SUTURE) ×1 IMPLANT
SUT VIC AB 4-0 KS 27 (SUTURE) IMPLANT
SYR CONTROL 10ML LL (SYRINGE) IMPLANT
TOWEL OR 17X24 6PK STRL BLUE (TOWEL DISPOSABLE) ×2 IMPLANT
TRAY FOLEY W/BAG SLVR 14FR LF (SET/KITS/TRAYS/PACK) ×2 IMPLANT

## 2018-08-20 NOTE — Anesthesia Preprocedure Evaluation (Signed)
Anesthesia Evaluation  Patient identified by MRN, date of birth, ID band Patient awake    Reviewed: Allergy & Precautions, NPO status , Patient's Chart, lab work & pertinent test results  Airway Mallampati: II  TM Distance: >3 FB Neck ROM: Full    Dental  (+) Chipped, Dental Advisory Given,    Pulmonary neg pulmonary ROS,    breath sounds clear to auscultation       Cardiovascular negative cardio ROS   Rhythm:Regular Rate:Normal     Neuro/Psych PSYCHIATRIC DISORDERS Bipolar Disorder negative neurological ROS     GI/Hepatic negative GI ROS, Neg liver ROS,   Endo/Other  negative endocrine ROS  Renal/GU negative Renal ROS     Musculoskeletal negative musculoskeletal ROS (+)   Abdominal (+) + obese,   Peds  Hematology negative hematology ROS (+) anemia ,   Anesthesia Other Findings Day of surgery medications reviewed with the patient.  Reproductive/Obstetrics (+) Pregnancy                             Anesthesia Physical  Anesthesia Plan  ASA: III  Anesthesia Plan: Spinal   Post-op Pain Management:    Induction:   PONV Risk Score and Plan: 2 and Treatment may vary due to age or medical condition  Airway Management Planned: Natural Airway  Additional Equipment:   Intra-op Plan:   Post-operative Plan:   Informed Consent: I have reviewed the patients History and Physical, chart, labs and discussed the procedure including the risks, benefits and alternatives for the proposed anesthesia with the patient or authorized representative who has indicated his/her understanding and acceptance.   Dental advisory given  Plan Discussed with: CRNA  Anesthesia Plan Comments:         Anesthesia Quick Evaluation                                  Anesthesia Evaluation  Patient identified by MRN, date of birth, ID band Patient awake    Reviewed: Allergy & Precautions, NPO status ,  Patient's Chart, lab work & pertinent test results  Airway Mallampati: II  TM Distance: >3 FB Neck ROM: Full    Dental  (+) Chipped, Dental Advisory Given,    Pulmonary neg pulmonary ROS,    breath sounds clear to auscultation       Cardiovascular negative cardio ROS   Rhythm:Regular Rate:Normal     Neuro/Psych PSYCHIATRIC DISORDERS Bipolar Disorder negative neurological ROS     GI/Hepatic negative GI ROS, Neg liver ROS,   Endo/Other  negative endocrine ROS  Renal/GU negative Renal ROS     Musculoskeletal negative musculoskeletal ROS (+)   Abdominal (+) + obese,   Peds  Hematology negative hematology ROS (+)   Anesthesia Other Findings Day of surgery medications reviewed with the patient.  Reproductive/Obstetrics                            Anesthesia Physical Anesthesia Plan  ASA: II  Anesthesia Plan: General   Post-op Pain Management:    Induction: Intravenous  PONV Risk Score and Plan: 4 or greater and Ondansetron, Dexamethasone, Midazolam, Scopolamine patch - Pre-op and Treatment may vary due to age or medical condition  Airway Management Planned: Oral ETT  Additional Equipment:   Intra-op Plan:   Post-operative Plan: Extubation in OR  Informed Consent:  I have reviewed the patients History and Physical, chart, labs and discussed the procedure including the risks, benefits and alternatives for the proposed anesthesia with the patient or authorized representative who has indicated his/her understanding and acceptance.   Dental advisory given  Plan Discussed with: CRNA  Anesthesia Plan Comments:         Anesthesia Quick Evaluation

## 2018-08-20 NOTE — Progress Notes (Signed)
H and P on the chart No changes Will proceed with Repeat LTCS Consent signed

## 2018-08-20 NOTE — Anesthesia Postprocedure Evaluation (Signed)
Anesthesia Post Note  Patient: Andrea [name]  Procedure(s) Performed: REPEAT CESAREAN SECTION (N/A )     Patient location during evaluation: PACU Anesthesia Type: Spinal Level of consciousness: oriented and awake and alert Pain management: pain level controlled Vital Signs Assessment: post-procedure vital signs reviewed and stable Respiratory status: spontaneous breathing and respiratory function stable Cardiovascular status: blood pressure returned to baseline and stable Postop Assessment: no headache, no backache and no apparent nausea or vomiting Anesthetic complications: no    Last Vitals:  Vitals:   08/20/18 1005 08/20/18 1015  BP:    Pulse:  69  Resp: 20 19  Temp:    SpO2:  98%    Last Pain:  Vitals:   08/20/18 1000  TempSrc: Axillary  PainSc:    Pain Goal: Patients Stated Pain Goal: 2 (08/20/18 0915)               Lynda Rainwater

## 2018-08-20 NOTE — Brief Op Note (Signed)
08/20/2018  8:47 AM  PATIENT:  Andrea [name]  37 y.o. female  PRE-OPERATIVE DIAGNOSIS:   IUP at 39 weeks Previous Cesarean Section  POST-OPERATIVE DIAGNOSIS:   Same  PROCEDURE:  Procedure(s) with comments: REPEAT CESAREAN SECTION (N/A) - Repeat edc 08/27/18 allergy to lamictal Heather,  RNFA  SURGEON:  Surgeon(s) and Role:    * Dian Queen, MD - Primary  PHYSICIAN ASSISTANT:   ASSISTANTS: none   ANESTHESIA:   spinal  EBL:  184 mL   BLOOD ADMINISTERED:none  DRAINS: Urinary Catheter (Foley)   LOCAL MEDICATIONS USED:  NONE  SPECIMEN:  No Specimen  DISPOSITION OF SPECIMEN:  N/A  COUNTS:  YES  TOURNIQUET:  * No tourniquets in log *  DICTATION: .Other Dictation: Dictation Number dictated  PLAN OF CARE: Admit to inpatient   PATIENT DISPOSITION:  PACU - hemodynamically stable.   Delay start of Pharmacological VTE agent (>24hrs) due to surgical blood loss or risk of bleeding: yes

## 2018-08-20 NOTE — Anesthesia Procedure Notes (Signed)
Spinal  Patient location during procedure: OB Start time: 08/20/2018 7:36 AM End time: 08/20/2018 7:41 AM Staffing Anesthesiologist: Lynda Rainwater, MD Performed: anesthesiologist  Preanesthetic Checklist Completed: patient identified, surgical consent, pre-op evaluation, timeout performed, IV checked, risks and benefits discussed and monitors and equipment checked Spinal Block Patient position: sitting Prep: site prepped and draped and DuraPrep Patient monitoring: heart rate, cardiac monitor, continuous pulse ox and blood pressure Approach: midline Location: L3-4 Injection technique: single-shot Needle Needle type: Pencan  Needle gauge: 24 G Needle length: 10 cm Assessment Sensory level: T4

## 2018-08-20 NOTE — Lactation Note (Addendum)
This note was copied from a baby's chart. Lactation Consultation Note  Patient Name: Andrea [name] [id number]'U Date: 08/20/2018 Reason for consult: Initial assessment;Term  13 hours old FT female who is being exclusively BF by her mother, she's a P2 and experienced BF. She was able to BF her first child for 3 months, but faced some BF difficulties when she had to go back to work, her milk supply dwindled until it completely disappeared. Mom has a Medela DEBP at home that she brought to the hospital, she also brought some NS with her that she used with her first child due to a difficult latch.  Baby was already nursing in cradle position when entering the room, mom has her swaddled while at the breast not doing STS, but voiced to [facility] this was the first feeding not doing STS. Baby had a somehow shallow latch, mom acknowledged it and told LC that baby was "comfort feeding" Baby fed for 13 minutes with audible swallows noted. Advised mom to break the latch when it's not deep to prevent sore nipples; and try again. Also noticed that mom had short shafted nipples. Mom voiced that she's been leaking since 38 weeks and that she's able to see colostrum when doing hand expression.   Mom was very proud that her baby latched on both breasts during the golden hour, praised her for her efforts. She also asked LC when can she start pumping because she wanted to build up her milk supply early in order to have enough when she had to go back to work. LC recommended starting pumping while at the hospital, reviewed the onset of lactogenesis II and cluster feeding.  Feeding plan  1. Encouraged mom to feed baby STS 8-12 times/24 hours or sooner if feeding cues are present 2. Hand expression and spoon feeding was also encouraged 3. Mom will start pumping tonight, every 3 hours after feedings; she'll be using her personal Medela DEBP.   BF brochure, BF resources and feeding diary were reviewed. Parents reported all  questions and concerns were answered, they're both aware of [place] services and will call PRN.  Maternal Data Formula Feeding for Exclusion: No Has patient been taught Hand Expression?: Yes Does the patient have breastfeeding experience prior to this delivery?: Yes  Feeding Feeding Type: Breast Fed  LATCH Score Latch: Grasps breast easily, tongue down, lips flanged, rhythmical sucking.  Audible Swallowing: A few with stimulation  Type of Nipple: Everted at rest and after stimulation(short shafted)  Comfort (Breast/Nipple): Soft / non-tender  Hold (Positioning): Assistance needed to correctly position infant at breast and maintain latch.  LATCH Score: 8  Interventions Interventions: Breast feeding basics reviewed;Breast massage;Breast compression;Support pillows;Assisted with latch  Lactation Tools Discussed/Used WIC Program: No   Consult Status Consult Status: Follow-up Date: 08/21/18 Follow-up type: In-patient    Andrea [name] Andrea [name] 08/20/2018, 9:15 PM

## 2018-08-20 NOTE — Transfer of Care (Addendum)
Immediate Anesthesia Transfer of Care Note  Patient: Andrea [name]  Procedure(s) Performed: REPEAT CESAREAN SECTION (N/A )  Patient Location: PACU  Anesthesia Type:Spinal  Level of Consciousness: awake  Airway & Oxygen Therapy: Patient Spontanous Breathing  Post-op Assessment: Report given to RN and Post -op Vital signs reviewed and stable  Post vital signs: Reviewed and stable  Last Vitals:  Vitals Value Taken Time  BP 113/55 08/20/2018  8:50 AM  Temp    Pulse    Resp 15 08/20/2018  8:52 AM  SpO2    Vitals shown include unvalidated device data.  Last Pain:  Vitals:   08/20/18 0607  TempSrc: Oral  PainSc: 0-No pain         Complications: No apparent anesthesia complications

## 2018-08-20 NOTE — Op Note (Signed)
NAMECHRISTOL, [name] MEDICAL RECORD FT:[id number] ACCOUNT [id number] DATE OF BIRTH:Feb 06, 1981 FACILITY: [facility] LOCATION: [id number] PHYSICIAN:Johanna Matto Lynett Fish, MD  OPERATIVE REPORT  DATE OF PROCEDURE:  08/20/2018  PREOPERATIVE DIAGNOSIS:  Intrauterine pregnancy at 39 weeks, previous cesarean section.  POSTOPERATIVE DIAGNOSIS:  pregnancy at 39 weeks, previous cesarean  section.  PROCEDURE:  Repeat low transverse cesarean section.  SURGEON:  Dian Queen, MD  ANESTHESIA:  Spinal.  ESTIMATED BLOOD LOSS:  628 mL  COMPLICATIONS:  None.  DRAINS:  Foley.  DESCRIPTION OF PROCEDURE:  The patient was taken to the operating room.  Her spinal was placed in the usual sterile fashion.  A low transverse incision was made, carried down to the fascia.  Fascia was scored in the midline and extended laterally.  The  rectus muscles were separated in the midline.  The peritoneum was entered bluntly.  The peritoneal incision was then stretched.  It was noted upon entry into the peritoneal cavity that there were dense adhesions involving the upper part of the uterus to  the abdominal wall.  I essentially created a window in order to make the uterine incision.  I think in the future, if she were to need a hysterectomy, she would need to have an abdominal hysterectomy.  A low transverse incision was made in the uterus.   The uterus was then entered using hemostat.  Amniotic fluid was clear.  The baby was delivered easily with a vacuum extractor and was a female infant.  The cord was clamped and cut and the baby was handed to the waiting neonatal team.  The uterus was  cleared of all clots and debris after the placenta was delivered.  The uterine incision was closed in two layers using 0 chromic in a running locked stitch.  Irrigation was performed.  Hemostasis was good.  I did place some Arista across the incision.  I  also placed a piece of Interceed across the uterine incision to hopefully minimize  adhesion formation.  We then closed the peritoneum using 0 Vicryl.  The fascia was closed using 0 Vicryl starting in each corner and meeting in the midline.  After  irrigation of subcutaneous layer, the subcutaneous was closed with 0 chromic and the skin was closed with a subcuticular using 3-0 Vicryl on a Keith needle.  Steri-Strips were applied.  A bandage was applied.  All sponge counts were correct x2.  The  patient tolerated the procedure well and went to recovery room in stable condition.  TN/NUANCE  D:08/20/2018 T:08/20/2018 JOB:[id number]

## 2018-08-21 LAB — CBC
HCT: 26.3 % — ABNORMAL LOW (ref 36.0–46.0)
HEMOGLOBIN: 8.5 g/dL — AB (ref 12.0–15.0)
MCH: 34.8 pg — ABNORMAL HIGH (ref 26.0–34.0)
MCHC: 32.3 g/dL (ref 30.0–36.0)
MCV: 107.8 fL — ABNORMAL HIGH (ref 80.0–100.0)
Platelets: 262 10*3/uL (ref 150–400)
RBC: 2.44 MIL/uL — ABNORMAL LOW (ref 3.87–5.11)
RDW: 15.8 % — AB (ref 11.5–15.5)
WBC: 10.8 10*3/uL — ABNORMAL HIGH (ref 4.0–10.5)
nRBC: 0 % (ref 0.0–0.2)

## 2018-08-21 LAB — BIRTH TISSUE RECOVERY COLLECTION (PLACENTA DONATION)

## 2018-08-21 NOTE — Lactation Note (Signed)
This note was copied from a baby's chart. Lactation Consultation Note  Patient Name: Andrea [name] LTRVU'Y Date: 08/21/2018 Reason for consult: Follow-up assessment;Term P2, 91 hour female infant., c/s delivery. Infant with  Mom short shafted nipple but infant is latching well now without NS. Per parents, infant had  6 voids and 8 stools in past 24 hours. LC entered room infant was latched on the  right breast in cradle hold position with a wiide mouth gape and swallowing was observed by LC. Infant BF for 15 minutes. Mom hand expressed afterwards and  4 ml of colostrum was spoon feed to infant after BF. Mom's current BF plans:  1. Mom will continue to BF according hunger cues, 8 to 12 times within 24 hours and not exceed 3 hours without BF. 2. Mom plans to hand express and give infant back EBM afterwards this is Mom's  BF  choice. 3. Mom plans to  start using her personal medela  DEBP from home and will pump 8 times per day  for 15 minutes for breast induction and stimulation.   Maternal Data Formula Feeding for Exclusion: No Has patient been taught Hand Expression?: Yes(Mom gave infant  4 ml of colostrum on spoon.)  Feeding Feeding Type: Breast Fed  LATCH Score Latch: Grasps breast easily, tongue down, lips flanged, rhythmical sucking.  Audible Swallowing: Spontaneous and intermittent  Type of Nipple: Everted at rest and after stimulation  Comfort (Breast/Nipple): Soft / non-tender(Mom is short shafted )  Hold (Positioning): No assistance needed to correctly position infant at breast.  LATCH Score: 10  Interventions Interventions: Hand express;DEBP  Lactation Tools Discussed/Used Pump Review: Setup, frequency, and cleaning;Milk Storage Initiated by:: [name], IBCLC Date initiated:: 08/21/18   Consult Status Consult Status: Follow-up Date: 08/22/18 Follow-up type: In-patient    [name] 08/21/2018, 10:38 PM

## 2018-08-21 NOTE — Progress Notes (Signed)
Subjective: Postpartum Day 1: Cesarean Delivery - scheduled repeat Patient reports tolerating PO, + flatus and no problems voiding.    Objective: Vital signs in last 24 hours: Temp:  [97.7 F (36.5 C)-98.5 F (36.9 C)] 97.7 F (36.5 C) (12/06 0530) Pulse Rate:  [53-87] 56 (12/06 0530) Resp:  [16-22] 18 (12/06 0530) BP: (103-123)/(60-97) 106/60 (12/06 0530) SpO2:  [96 %-100 %] 98 % (12/06 0530)  Physical Exam:  General: alert, cooperative and appears stated age 9: appropriate Uterine Fundus: firm Incision: healing well, no significant drainage, no dehiscence, no significant erythema DVT Evaluation: No evidence of DVT seen on physical exam. Negative Homan's sign. No cords or calf tenderness. No significant calf/ankle edema.  Recent Labs    08/19/18 1057 08/21/18 0602  HGB 9.6* 8.5*  HCT 29.7* 26.3*    Assessment/Plan: Status post Cesarean section. Doing well postoperatively.  Continue current care. Baby girl doing well. H/o anemia - hgb 8.5 this AM, no s/s.    Tyson Dense 08/21/2018, 9:26 AM

## 2018-08-21 NOTE — Clinical Social Work Maternal (Signed)
CLINICAL SOCIAL WORK MATERNAL/CHILD NOTE  Patient Details  Name: Andrea [name] MRN: [id number] Date of Birth: 03/14/1981  Date:  08/21/2018  Clinical Social Worker Initiating Note:  Mattalyn Anderegg Boyd-Gilyard Date/Time: Initiated:  08/21/18/1155     Child's Name:  Pennelope Mckibben   Biological Parents:  Mother, Father   Need for Interpreter:  None   Reason for Referral:  Behavioral Health Concerns(MOb has dx of bipolar disorder and had an edinburgh score of 13 )   Address:  [address] [place]  [address]    Phone number:  [phone] (home)     Additional phone number:   Household Members/Support Persons (HM/SP):   Household Member/Support Person 1, Household Member/Support Person 2   HM/SP Name Relationship DOB or Age  HM/SP -1 Christoper Bronaugh FOB 04/01/1991  HM/SP -2 Peyton Colver son 10/2014  HM/SP -3        HM/SP -4        HM/SP -5        HM/SP -6        HM/SP -7        HM/SP -8          Natural Supports (not living in the home):  Immediate Family, Extended Family, Other (Comment)(coworker)   Professional Supports: Therapist(MOB is an established patient with Dr. 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    Education:  Graduate degree   Homebound arranged:    Financial Resources:  Private Insurance   Other Resources:      Cultural/Religious Considerations Which May Impact Care:  None reported  Strengths:  Ability to meet basic needs , Home prepared for child , Pediatrician chosen, Understanding of illness   Psychotropic Medications:         Pediatrician:    [place] area  Pediatrician List:   [place]    [place] [facility] ([address])  [place]    [place]    [place]    [place]      Pediatrician Fax Number:    Risk Factors/Current Problems:  Mental Health Concerns    Cognitive State:  Alert , Able to Concentrate  , Insightful , Goal Oriented , Linear Thinking    Mood/Affect:  Relaxed , Happy , Bright , Comfortable , Interested    CSW Assessment: CSW met with MOB in room 131.  When CSW arrived, MOB was bonding with infant as evidence by engaging in breastfeeding. FOB was also present and was observing MOB and infant's interactions. CSW explained CSW's role and MOB gave CSW permission to complete assessment while FOB was present. During the assessment, FOB was supportive and was reassured CSW that he will continue to be supportive at home.   CSW reviewed MOB's Edinburgh score and MOB asked about MOB's MH hx.  MOB acknowledged a hx of bipolar dx and reported discontinuing medications about 1 year ago.  MOB stated that has been managing symptoms utilizing daily meditations and getting adequate sleep.  CSW provided education regarding the baby blues period vs. perinatal mood disorders, discussed treatment and gave resources for mental health follow up if concerns arise.  CSW recommends self-evaluation during the postpartum time period using the New Mom Checklist from Postpartum Progress and encouraged MOB to contact a medical professional if symptoms are noted at any time.  CSW did not present with any acute symptoms and reported having a good support team.  CSW assessed for safety and   MOB denied and HI.   CSW provided review of Sudden Infant Death Syndrome (SIDS) precautions.  CSW identifies no further need for intervention and no barriers to discharge at this time.   CSW Plan/Description:  No Further Intervention Required/No Barriers to Discharge, Sudden Infant Death Syndrome (SIDS) Education, Perinatal Mood and Anxiety Disorder (PMADs) Education   Laurey Arrow, MSW, LCSW Clinical Social Work [phone]   Dimple Nanas, LCSW 08/21/2018, 1:59 PM

## 2018-08-22 MED ORDER — OXYCODONE HCL 5 MG PO TABS: 5.0000 mg | ORAL_TABLET | ORAL | 0 refills | Status: DC | PRN

## 2018-08-22 MED ORDER — DOCUSATE SODIUM 100 MG PO CAPS: 100.0000 mg | ORAL_CAPSULE | Freq: Two times a day (BID) | ORAL | 2 refills | Status: DC

## 2018-08-22 MED ORDER — IBUPROFEN 600 MG PO TABS: 600.0000 mg | ORAL_TABLET | Freq: Four times a day (QID) | ORAL | 0 refills | Status: DC | PRN

## 2018-08-22 NOTE — Discharge Summary (Signed)
Obstetric Discharge Summary Reason for Admission: cesarean section Prenatal Procedures: none Intrapartum Procedures: cesarean: low cervical, transverse Postpartum Procedures: none Complications-Operative and Postpartum: none Hemoglobin  Date Value Ref Range Status  08/21/2018 8.5 (L) 12.0 - 15.0 g/dL Final   HCT  Date Value Ref Range Status  08/21/2018 26.3 (L) 36.0 - 46.0 % Final    Physical Exam:  General: alert, cooperative and appears stated age 37: appropriate Uterine Fundus: firm Incision: healing well, no significant drainage, no dehiscence, no significant erythema DVT Evaluation: No evidence of DVT seen on physical exam. Negative Homan's sign. No cords or calf tenderness. No significant calf/ankle edema.  Discharge Diagnoses: Term Pregnancy-delivered  Discharge Information: Date: 08/22/2018 Activity: pelvic rest Diet: routine Medications: Ibuprofen, Colace and Percocet Condition: stable Instructions: refer to practice specific booklet Discharge to: home   Newborn Data: Live born female  Birth Weight: 7 lb 1.6 oz (3221 g) APGAR: 9, 9  Newborn Delivery   Birth date/time:  08/20/2017 08:06:00 Delivery type:  C-Section, Vacuum Assisted Trial of labor:  No C-section categorization:  Repeat     Home with mother.  Andrea [name] 08/22/2018, 8:24 AM

## 2018-08-22 NOTE — Lactation Note (Signed)
This note was copied from a baby's chart. Lactation Consultation Note:  Mother breastfeeding infant on the rt breast in football hold.  Observed infant with good burst of suckling and swallows.  Infant released the breast and observed that nipple was compressed  With blanched ridge across the nipple. Mother reports that the left nipple is very sore.  Observed scab and positional strip on the left nipple.  Assist mother with latching infant on the left breast in cross cradle hold.  Infant latched with better depth. Infant sustained latch for 15 mins.  Assist mother with hand expression. Observed large drops.  Mother has own Medela DEBP at the bedside that she prefers to use.  She was given a new kit for her pump. Advised mother to pump for 15-20 mins and use ebm/formula to supplement infant.  Mother has supplemental guidelines at the bedside for amts.  Advised mother to breast feedin g8-12 times or more in 24 hours.  Discussed cue base feeding and cluster feeding.  Discouraged the use of a pacifier. Discussed treatment and prevention of engorgement.  Encouraged FOB to do frequent STS. Encouraged mother to follow up with [facility] services as needed OP dept and BFSG.  Patient Name: Andrea [name] HCCEQ'D Date: 08/22/2018 Reason for consult: Follow-up assessment   Maternal Data    Feeding Feeding Type: Breast Fed  LATCH Score Latch: Grasps breast easily, tongue down, lips flanged, rhythmical sucking.  Audible Swallowing: Spontaneous and intermittent  Type of Nipple: Everted at rest and after stimulation  Comfort (Breast/Nipple): Filling, red/small blisters or bruises, mild/mod discomfort(observed pinched tip of the nipple when infant released)  Hold (Positioning): Assistance needed to correctly position infant at breast and maintain latch.  LATCH Score: 8  Interventions Interventions: Assisted with latch;Skin to skin;Breast massage;Hand express;Pre-pump if needed;Reverse  pressure;Breast compression;Adjust position;Support pillows;Position options;Expressed milk;Comfort gels  Lactation Tools Discussed/Used Pump Review: Setup, frequency, and cleaning;Milk Storage Initiated by:: mothers own pump Date initiated:: 08/22/18   Consult Status Consult Status: Complete    Darla Lesches 08/22/2018, 10:31 AM

## 2018-08-23 ENCOUNTER — Telehealth (HOSPITAL_COMMUNITY): Payer: Self-pay

## 2018-08-23 LAB — BPAM RBC
BLOOD PRODUCT EXPIRATION DATE: 202001042359
Blood Product Expiration Date: 201912292359
Unit Type and Rh: 5100
Unit Type and Rh: 5100

## 2018-08-23 LAB — TYPE AND SCREEN
ABO/RH(D): O POS
ANTIBODY SCREEN: POSITIVE
Donor AG Type: NEGATIVE
Donor AG Type: NEGATIVE
PT AG Type: NEGATIVE
UNIT DIVISION: 0
Unit division: 0

## 2018-08-24 ENCOUNTER — Ambulatory Visit: Payer: Self-pay

## 2018-08-24 DIAGNOSIS — R633 Feeding difficulties: Secondary | ICD-10-CM | POA: Diagnosis not present

## 2018-08-24 NOTE — Lactation Note (Signed)
This note was copied from a baby's chart. 08/24/2018  Name: Andrea [name] MRN: [id number] Date of Birth: 08/20/2017 Gestational Age: Gestational Age: [redacted]w[redacted]d Birth Weight: 113.6 oz Weight today:    6 pounds 11.5 ounces (3046 grams) with clean newborn diaper  Infant presents today with mom and dad for feeding assistance. Mom has not been able to latch infant to the breast due to nipple pain and is pumping and bottle feeding.   Infant has gained 140 grams in the last 2 days with an average daily weight gain of 70 grams a day.   Mom reports she is pumping 5 x a day and getting about 10-15 ml per pumping. Enc mom to increase her pumping to 8 x a day. Mom has increased to # 27 flanges and using Coconut oil prior to pumping, mom reports she is able to tolerate pumping better. Mom keeps the suction low on the pump currently, enc her to increase as she is able to tolerate. Mom using a Medela PIS that she used with her last child, she has recently changed the membranes in the pump.   Parents were shown tongue and lip frenulums and how they can effect milk supply and BF. Information given on websites and local providers. To reassess at next feeding assessment.   Infant is being supplemented with a bottle of EBM/Formula.   Dad called in [facility] request to Dr. Kevin Fenton office while they were here. Nipple care discussed.   Mom with Periareolar scars form breast lift/Augmentation 7 years ago. Mom reports the surgeon reported that he tried to preserve the ducts. Mom reports increased sensation to nipples. Mom reports low milk supply with her son who is 75 yo.   Infant fed well on the breast. Assisted mom with applying the NS correctly and reports the feeding felt much better. Infant fed with some stimulation needed with feeding.   Mom was given information on [place] to try if milk production is not up by 1 week after delivery. Reviewed hands on pumping and hands free bra.   Mom given [facility]far website to  review about post surgery and BF.   Infant to follow up with Dr. Carolynn Sayers on 12/19. Mom has not heard from [facility] yet. Mom aware of the BF Support Groups. Infant to follow up with Lactation on Dec. 19 @ 11:30 am.       General Information: Mother's reason for visit: Feeding assessment, Nipple Pain  Consult: Initial Lactation consultant: Nonah Mattes RN,IBCLC Breastfeeding experience: not latching due to nipple pain Maternal medical conditions: Breast augmentation(Augmentation/lift 7 years ago, Periareolat incisions, Surgeon told mom he spared as many ducts as he could) Maternal medications: Pre-natal vitamin, Stool softener, Motrin (ibuprofen), Percocet, Other(Mother's Milk Tea 1 cup a day)  Breastfeeding History: Frequency of breast feeding: not latching Currently    Supplementation: Supplement method: bottle(Dr. Brown's) BrandFawn Kirk Formula volume: 40-60 ml Formula frequency: every 2-3 hours   Breast milk volume: 10 ml Breast milk frequency: 5 x a day Total breast milk volume per day: 45 Pump type: Medela pump in style Pump frequency: 5 x a day Pump volume: 10-15 ml  Infant Output Assessment: Voids per 24 hours: 6-7 Urine color: Clear yellow Stools per 24 hours: 3-4  Stool color: Yellow  Breast Assessment: Breast: Soft, Compressible Nipple: Erect, Scabs, Cracked, Blister Pain level: 6 Pain interventions: Bra, Coconut oil, Expressed breast milk, Breast pump, Nipple shield  Feeding Assessment: Infant oral assessment: Variance Infant oral assessment comment: Infant  with thin short labial frenulum that inserts at the bottom of the gum ridge. upper lip tight and blanches with flanging, upper lip needs flanging on the breast. infant with strong suckle on gloved finger with good tongue cupping and  extension. infant with short posterior lingual frenulum. mom not able to latch without the NS and has excoriated nipples.  Positioning: Cross cradle(left breast,  20 minutes) Latch: 1 - Repeated attempts needed to sustain latch, nipple held in mouth throughout feeding, stimulation needed to elicit sucking reflex. Audible swallowing: 2 - Spontaneous and intermittent Type of nipple: 2 - Everted at rest and after stimulation Comfort: 1 - Filling, red/small blisters or bruises, mild/mod discomfort Hold: 2 - No assistance needed to correctly position infant at breast LATCH score: 8 Latch assessment: Deep Lips flanged: No(upper lip needs flanging) Suck assessment: Displays both Tools: Nipple shield 24 mm Pre-feed weight: 3046 grams Post feed weight: 3074 grams Amount transferred: 28 ml    Additional Feeding Assessment:                                    Totals: Total amount transferred: 28 ml Total supplement given: 30 ml formula from bottle  Total amount pumped post feed: 10 ml   Plan:   1. Offer infant the breast with feeding cues as mom is able, try to breast feed her at least 2 times a day and work up as mom is able to feed. Limit Breast feeding to 20 minutes if she is sleepy at the breast until she is more efficient at the breast 2. Keep infant awake at the breast as needed 3. Massage/compress breast with feeding when infant slows down 4. Use the # 24 Nipple Shield with feeding as needed for pain 5. Continue pumping about 8 times a day for 15 minutes with your Double Electric Breast pump to empty the breasts. Use Coconut oil to nipples prior to pumping 6. Use the expressed milk to supplement infant, offer infant a bottle after breast feeding if she is still cueing to feed 7. Continue to use the Dr. Saul Fordyce 8. Feed infant the bottle using the paced bottle feeding method (video on kellymom.com) 9. Infant needs about 56-75 ml (2-2.5 ounces) for 8 feedings a day or 450-600 ml (15-20 ounces) in 24 hours. Infant may take more or less depending on how often the infant feeds.  10. Call OB to ask for All Purpose Nipple Ointment 11.  Apply EBM to nipples post feeding then thin layer of APNO and then apply Comfort gets.  12. Keep up the good work 36. Call with any questions/concerns as needed [phone] 14. Thank you for allowing me to assist you today 15. Follow up with Lactation December 19 @ 11:30  [place], [place] 08/24/2018, 3:49 PM

## 2018-09-03 ENCOUNTER — Ambulatory Visit: Payer: Self-pay

## 2018-09-03 NOTE — Lactation Note (Signed)
This note was copied from a baby's chart. 09/03/2018  Name: Andrea [name] MRN: [id number] Date of Birth: 08/20/2017 Gestational Age: Gestational Age: [redacted]w[redacted]d Birth Weight: 113.6 oz Weight today:    7 pounds 12.1 ounces (3518 grams) with a clean newborn diaper   56 week old Infant presents today with mom and dad for follow up feeding assessment.   Infant has gained 472 grams in the last 10 days with an average daily weight gain of 47 grams a day.   Infant is feeding at the breast now. She is getting some supplementation of EBM and formula via bottle.   Mom with pain to left nipple with some areolar swelling noted. Mom with slight pink ring around nipple. It appears to be trauma from pump flanges. Mom c/o severe pain with latch that improves with feeding. Mom usually needs to use the NS with feeding. Mom is using # 27 flanges with pumping, Enc mom to change back to the # 24 flanges with Coconut oil prior to pumping.   Infant was fed briefly in the office without the NS and mom reports the same amount of pain. Infant did not nurse much as she was fed prior to coming in .   Mom may have nipple nerve damage from surgery. Enc mom to try cool or warmth to nipple to see if that helps. Mom may have had some plugs to the left breast, discussed Sunflower Lecithin and how that may help.   Infant to follow up with Dr. Carolynn [name] at 32 month of age. [facility] has contacted mom and she plans to call them back to schedule. Mom aware of BF Support Groups. Infant to follow up with Lactation as needed.   Parents report all questions/concerns have been answered.   General Information: Mother's reason for visit: Follow up feeding assessment Consult: Follow-up Lactation consultant: Andrea Mattes RN,IBCLC Breastfeeding experience: latching better, nipples have healed Maternal medical conditions: Breast augmentation(Periareolar incisions) Maternal medications: Pre-natal vitamin, Motrin (ibuprofen), Stool  softener  Breastfeeding History: Frequency of breast feeding: every 3 hours Duration of feeding: 40 minutes  Supplementation: Supplement method: bottle(Dr. Brown's ) Brand: Andrea [name] Formula volume: 60 ml Formula frequency: 1 x a day Total formula volume per day: 2 ounces Breast milk volume: 3.5-4 ounces Breast milk frequency: 2 x a day Total breast milk volume per day: 7-8 ounces Pump type: Medela pump in style Pump frequency: 2-4 x a day Pump volume: 3.5-4 ounces  Infant Output Assessment: Voids per 24 hours: 7-10 Urine color: Clear yellow Stools per 24 hours: 1 Stool color: Yellow  Breast Assessment: Breast: Soft, Compressible Nipple: Erect, Reddened Pain level: 6(pain to left nipple with latch, 4 at rest, 2 on the right breast) Pain interventions: Bra, Coconut oil, Expressed breast milk, All purpose nipple cream, Nipple shield  Feeding Assessment:   Infant oral assessment comment: Infant with short labial frenulum that inserts at the bottom of the gum ridge, upper lip tight with flanging, she flanged well on the breast today. infant with strong suckle on gloved finger with good tongue extension and supping. tongue mobility looks good today.  Positioning: Cross cradle(left breast, 2-3 minutes) Latch: 2 - Grasps breast easily, tongue down, lips flanged, rhythmical sucking. Audible swallowing: 1 - A few with stimulation Type of nipple: 2 - Everted at rest and after stimulation Comfort: 1 - Filling, red/small blisters or bruises, mild/mod discomfort Hold: 2 - No assistance needed to correctly position infant at breast LATCH score: 8 Latch assessment:  Deep Lips flanged: Yes Suck assessment: Displays both   Pre-feed weight: 3518 grams Post feed weight: 3526 grams Amount transferred: 8 ml    Additional Feeding Assessment:                                    Totals: Total amount transferred: 8 ml   Total amount pumped post feed: ddi not pump    Plan: 1. Offer infant the breast with feeding cues , offer both breasts with each feeding if infant wants 2. Keep infant awake at the breast as needed 3. Massage/compress breast with feeding when infant slows down 4. Use the # 24 Nipple Shield with feeding as needed for pain, try each day without the nipple shield 5. Continue pumping 4 x a day. Go back to the # 24 flanges with pumping. Use Coconut oil to nipples prior to pumping 6. Use the expressed milk to supplement infant, offer infant a bottle after breast feeding if she is still cueing to feed 7. Continue to use the Dr. Saul Fordyce bottle for feeding 8. Feed infant the bottle using the paced bottle feeding method (video on kellymom.com) 9. Infant needs about 66-87 ml (2.5-3 ounces) for 8 feedings a day or 525-700 ml (18-23 ounces) in 24 hours. Infant may take more or less depending on how often the infant feeds.  10. If plugs continue can try Sunflower Lecithin 1200 mg 4 capsules a day (breakfast, lunch, dinner, and bedtime) once plugs are resolved can decrease to 2 times a day 11. Continue All Purpose Nipple Ointment for now as needed  12. Keep up the good work 41. Call with any questions/concerns as needed [phone] 14. Thank you for allowing me to assist you today 15. Follow up with Lactation as needed    Andrea Pierini RN, IBCLC                                                     Andrea [name] Andrea [name] 09/03/2018, 11:53 AM

## 2018-09-29 DIAGNOSIS — Z1389 Encounter for screening for other disorder: Secondary | ICD-10-CM | POA: Diagnosis not present

## 2018-09-29 DIAGNOSIS — F53 Postpartum depression: Secondary | ICD-10-CM | POA: Diagnosis not present

## 2018-10-20 DIAGNOSIS — L259 Unspecified contact dermatitis, unspecified cause: Secondary | ICD-10-CM | POA: Diagnosis not present

## 2018-10-20 DIAGNOSIS — F53 Postpartum depression: Secondary | ICD-10-CM | POA: Diagnosis not present

## 2018-10-29 LAB — HM PAP SMEAR

## 2019-01-06 ENCOUNTER — Ambulatory Visit (INDEPENDENT_AMBULATORY_CARE_PROVIDER_SITE_OTHER): Payer: BLUE CROSS/BLUE SHIELD | Admitting: Family Medicine

## 2019-01-06 ENCOUNTER — Telehealth: Payer: BLUE CROSS/BLUE SHIELD | Admitting: Family

## 2019-01-06 ENCOUNTER — Encounter: Payer: Self-pay | Admitting: Family Medicine

## 2019-01-06 ENCOUNTER — Telehealth: Payer: Self-pay | Admitting: *Deleted

## 2019-01-06 ENCOUNTER — Other Ambulatory Visit: Payer: Self-pay

## 2019-01-06 VITALS — Ht 64.0 in | Wt 220.0 lb

## 2019-01-06 DIAGNOSIS — F909 Attention-deficit hyperactivity disorder, unspecified type: Secondary | ICD-10-CM | POA: Diagnosis not present

## 2019-01-06 DIAGNOSIS — J302 Other seasonal allergic rhinitis: Secondary | ICD-10-CM

## 2019-01-06 DIAGNOSIS — F3181 Bipolar II disorder: Secondary | ICD-10-CM | POA: Diagnosis not present

## 2019-01-06 MED ORDER — FLUTICASONE PROPIONATE 50 MCG/ACT NA SUSP: 2.0000 | Freq: Every day | NASAL | 0 refills | Status: DC

## 2019-01-06 MED ORDER — AMPHETAMINE-DEXTROAMPHET ER 10 MG PO CP24: 10.0000 mg | ORAL_CAPSULE | Freq: Two times a day (BID) | ORAL | 0 refills | Status: DC

## 2019-01-06 MED ORDER — LEVOCETIRIZINE DIHYDROCHLORIDE 5 MG PO TABS: 5.0000 mg | ORAL_TABLET | Freq: Every evening | ORAL | 0 refills | Status: DC

## 2019-01-06 NOTE — Telephone Encounter (Signed)
PA Started through [place] [facility]  Adderall XR 10mg  2 tab daily.  KEY: [id number]

## 2019-01-06 NOTE — Progress Notes (Signed)
I have discussed the procedure for the virtual visit with the patient who has given consent to proceed with assessment and treatment.   BETHANY DILLARD, CMA     

## 2019-01-06 NOTE — Progress Notes (Signed)
   Virtual Visit via Video   I connected with patient on 01/06/19 at 10:20 AM EDT by a video enabled telemedicine application and verified that I am speaking with the correct person using two identifiers.  Location patient: Home Location provider: [facility], Office Persons participating in the virtual visit: Patient, Provider, [place] ([place])  I discussed the limitations of evaluation and management by telemedicine and the availability of in person appointments. The patient expressed understanding and agreed to proceed.  Subjective:   HPI:   Anxiety/Depression- Pt has hx of Bipolar II disorder.  Not currently on meds.  Was breast feeding up until 3 days ago.  For last 3 weeks has really struggled w/ anxiety.  Family has pushed her to reach out to restart meds.  'Scattered, can't focus'.  Trying to work from home w/ a 37 yr old and a 22 month old.  Big financial losses w/ COVID situation.  Increased irritability.  GAD 42.  Was previously on Adderall 10mg  BID.  Previously on Prozac, Lamictal, Ativan.  ROS:   See pertinent positives and negatives per HPI.  Patient Active Problem List   Diagnosis Date Noted  . Previous cesarean section 08/20/2018  . S/P cesarean section 08/20/2018  . Anemia in pregnancy, third trimester 08/07/2018  . Acute appendicitis 07/15/2017  . Physical exam 04/24/2017  . ADD (attention deficit disorder) 06/18/2016  . Obesity (BMI 30-39.9) 06/18/2016  . 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 Smoking status: Never Smoker  . Smokeless tobacco: Never Used  Substance Use Topics  . Alcohol use: Not Currently    Comment: wine, weekly    Current Outpatient Medications:  .  Prenatal Vit-Fe Fumarate-FA (MULTIVITAMIN-PRENATAL) 27-0.8 MG TABS tablet, Take 1 tablet by mouth daily at 12 noon., Disp: , Rfl:  .  fluticasone (FLONASE) 50 MCG/ACT nasal spray, Place 2 sprays into both nostrils daily., Disp: 16 g, Rfl: 0 .   levocetirizine (XYZAL) 5 MG tablet, Take 1 tablet (5 mg total) by mouth every evening., Disp: 30 tablet, Rfl: 0  Allergies  Allergen Reactions  . Feraheme [Ferumoxytol] Anaphylaxis    Chest pain during infusion  . Dairy Aid [Lactase] Nausea And Vomiting  . Lamictal [Lamotrigine] Other (See Comments)    SJS     Objective:   Ht 5\' 4"  (1.626 m)   Wt 220 lb (99.8 kg)   BMI 37.76 kg/m   AAOx3, NAD NCAT, EOMI No obvious CN deficits Coloring WNL Pt is able to speak clearly, coherently without shortness of breath or increased work of breathing.  Thought process is linear.  Mood is appropriate.   Assessment and Plan:   Hypomanic/ADHD- Deteriorated. pt is unable to focus and this is causing a dramatic worsening of her anxiety.  We reviewed her previous medication regimens in the past and Adderall was by far the most effective.  Will restart at the 10mg  BID and monitor closely for improvement.  Pt expressed understanding and is in agreement w/ plan.    Annye Asa, MD 01/06/2019

## 2019-01-06 NOTE — Progress Notes (Signed)
E visit for Allergic Rhinitis We are sorry that you are not feeling well.  Here is how we plan to help!  Based on what you have shared with me it looks like you have Allergic Rhinitis.  Rhinitis is when a reaction occurs that causes nasal congestion, runny nose, sneezing, and itching.  Most types of rhinitis are caused by an inflammation and are associated with symptoms in the eyes ears or throat. There are several types of rhinitis.  The most common are acute rhinitis, which is usually caused by a viral illness, allergic or seasonal rhinitis, and nonallergic or year-round rhinitis.  Nasal allergies occur certain times of the year.  Allergic rhinitis is caused when allergens in the air trigger the release of histamine in the body.  Histamine causes itching, swelling, and fluid to build up in the fragile linings of the nasal passages, sinuses and eyelids.  An itchy nose and clear discharge are common.  I recommend the following over the counter treatments: Xyzal 5 mg take 1 tablet daily  I also would recommend a nasal spray: Flonase 2 sprays into each nostril once daily    HOME CARE:   You can use an over-the-counter saline nasal spray as needed  Avoid areas where there is heavy dust, mites, or molds  Stay indoors on windy days during the pollen season  Keep windows closed in home, at least in bedroom; use air conditioner.  Use high-efficiency house air filter  Keep windows closed in car, turn AC on re-circulate  Avoid playing out with dog during pollen season  GET HELP RIGHT AWAY IF:   If your symptoms do not improve within 10 days  You become short of breath  You develop yellow or green discharge from your nose for over 3 days  You have coughing fits  MAKE SURE YOU:   Understand these instructions  Will watch your condition  Will get help right away if you are not doing well or get worse  Thank you for choosing an e-visit. Your e-visit answers were reviewed by a  board certified advanced clinical practitioner to complete your personal care plan. Depending upon the condition, your plan could have included both over the counter or prescription medications. Please review your pharmacy choice. Be sure that the pharmacy you have chosen is open so that you can pick up your prescription now.  If there is a problem you may message your provider in [place] to have the prescription routed to another pharmacy. Your safety is important to [place]. If you have drug allergies check your prescription carefully.  For the next 24 hours, you can use MyChart to ask questions about today's visit, request a non-urgent call back, or ask for a work or school excuse from your e-visit provider. You will get an email in the next two days asking about your experience. I hope that your e-visit has been valuable and will speed your recovery.      Greater than 5 minutes, yet less than 10 minutes of time have been spent researching, coordinating, and implementing care for this patient today.  Thank you for the details you included in the comment boxes. Those details are very helpful in determining the best course of treatment for you and help [place] to provide the best care.

## 2019-01-07 NOTE — Telephone Encounter (Signed)
Received paperwork from insurance that medication is denied.    PCP advises that patient will either have to pay for medication or she will have to go to 1 a day which is what insurance will approve.    I have left a message for patient to call me so that I can discuss with her.

## 2019-01-27 ENCOUNTER — Ambulatory Visit (INDEPENDENT_AMBULATORY_CARE_PROVIDER_SITE_OTHER): Payer: BLUE CROSS/BLUE SHIELD | Admitting: Family Medicine

## 2019-01-27 ENCOUNTER — Other Ambulatory Visit: Payer: Self-pay

## 2019-01-27 ENCOUNTER — Encounter: Payer: Self-pay | Admitting: Family Medicine

## 2019-01-27 VITALS — BP 108/76 | HR 59 | Ht 65.0 in | Wt 222.0 lb

## 2019-01-27 DIAGNOSIS — F909 Attention-deficit hyperactivity disorder, unspecified type: Secondary | ICD-10-CM | POA: Diagnosis not present

## 2019-01-27 NOTE — Progress Notes (Signed)
   Virtual Visit via Video   I connected with patient on 01/27/19 at  1:40 PM EDT by a video enabled telemedicine application and verified that I am speaking with the correct person using two identifiers.  Location patient: Home Location provider: Acup[facility]urist, Office Persons participating in the virtual visit: Patient, Provider, Treasure (Jess B)  I discussed the limitations of evaluation and management by telemedicine and the availability of in person appointments. The patient expressed understanding and agreed to proceed.  Subjective:   HPI:   Anxiety/ADHD- at last visit pt restarted her Adderall XR 10mg  BID.  Pt is only taking AM dose rather than BID dosing- 'i'm so much more clear'.  'it's been great'.  Attempting to get in a routine while working from home.  Now sleeping at night.  No longer anxious.    ROS:   See pertinent positives and negatives per HPI.  Patient Active Problem List   Diagnosis Date Noted  . Previous cesarean section 08/20/2018  . Physical exam 04/24/2017  . ADD (attention deficit disorder) 06/18/2016  . Obesity (BMI 30-39.9) 06/18/2016  . 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 Smoking status: Never Smoker  . Smokeless tobacco: Never Used  Substance Use Topics  . Alcohol use: Not Currently    Comment: wine, weekly    Current Outpatient Medications:  .  amphetamine-dextroamphetamine (ADDERALL XR) 10 MG 24 hr capsule, Take 1 capsule (10 mg total) by mouth 2 (two) times a day., Disp: 60 capsule, Rfl: 0 .  fluticasone (FLONASE) 50 MCG/ACT nasal spray, Place 2 sprays into both nostrils daily., Disp: 16 g, Rfl: 0 .  levocetirizine (XYZAL) 5 MG tablet, Take 1 tablet (5 mg total) by mouth every evening., Disp: 30 tablet, Rfl: 0 .  Prenatal Vit-Fe Fumarate-FA (MULTIVITAMIN-PRENATAL) 27-0.8 MG TABS tablet, Take 1 tablet by mouth daily at 12 noon., Disp: , Rfl:   Allergies  Allergen Reactions  . Feraheme [Ferumoxytol]  Anaphylaxis    Chest pain during infusion  . Dairy Aid [Lactase] Nausea And Vomiting  . Lamictal [Lamotrigine] Other (See Comments)    SJS     Objective:   BP 108/76   Pulse (!) 59   Ht 5\' 5"  (1.651 m)   Wt 222 lb (100.7 kg)   BMI 36.94 kg/m   AAOx3, NAD NCAT, EOMI No obvious CN deficits Coloring WNL Pt is able to speak clearly, coherently without shortness of breath or increased work of breathing.  Thought process is linear.  Mood is appropriate.   Assessment and Plan:   ADHD- improved.  Pt feels her ADHD and anxiety have both improved w/ restarting her Adderall.  She feels much better and is very pleased w/ current dose and once daily regimen.  No changes at this time.  Will continue to follow.   Annye Asa, MD 01/27/2019

## 2019-01-27 NOTE — Progress Notes (Signed)
I have discussed the procedure for the virtual visit with the patient who has given consent to proceed with assessment and treatment.   Kj Imbert L Morgaine Kimball, CMA     

## 2019-04-05 ENCOUNTER — Other Ambulatory Visit: Payer: Self-pay | Admitting: Family Medicine

## 2019-04-06 MED ORDER — AMPHETAMINE-DEXTROAMPHET ER 10 MG PO CP24: 10.0000 mg | ORAL_CAPSULE | Freq: Every day | ORAL | 0 refills | Status: DC

## 2019-04-06 NOTE — Telephone Encounter (Signed)
Last OV 01/27/19 adderall last filled 01/06/19 #60 with 0

## 2019-09-10 IMAGING — CT CT ABD-PELV W/ CM
2 of 4 series · 16 of 46 positions shown, 18 images · IV contrast (APPLIED)
Comparison: None.

CLINICAL DATA: 36-year-old female with a history of right lower
quadrant pain

EXAM:
CT ABDOMEN AND PELVIS WITH CONTRAST
TECHNIQUE: Multidetector CT imaging of the abdomen and pelvis was performed
using the standard protocol following bolus administration of
intravenous contrast.
CONTRAST:  100mL VX770Z-MJJ IOPAMIDOL (VX770Z-MJJ) INJECTION 61%

[Series 2: axial st · axial · 0.98mm/px · z∈[-562,-82]mm · 13 of 106 slices shown, 15 images]
[im 5/106  soft-tissue]
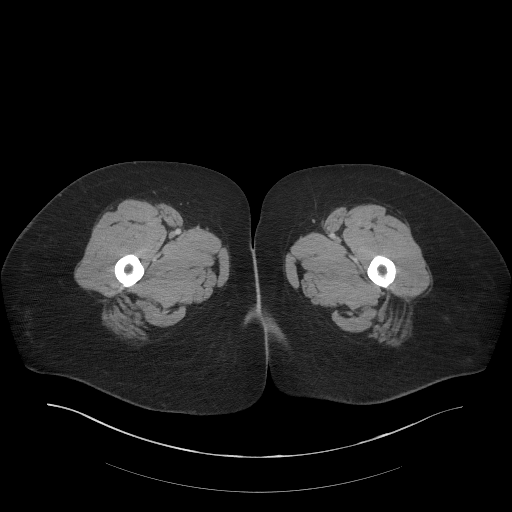
[im 5/106  bone]
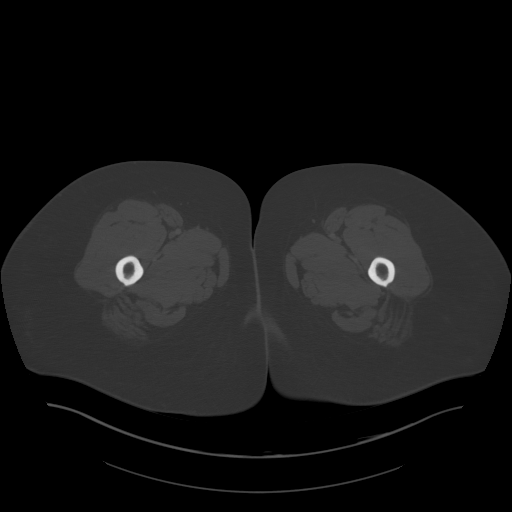
[im 13/106  soft-tissue]
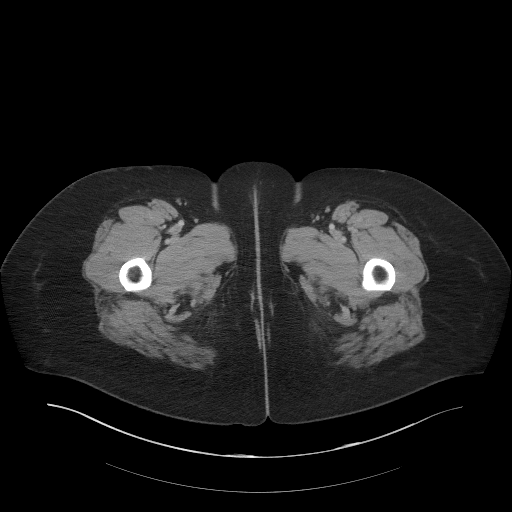
[im 22/106  soft-tissue]
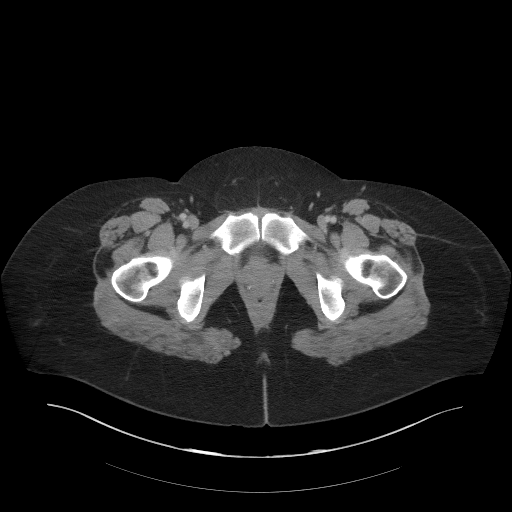
[im 30/106  soft-tissue]
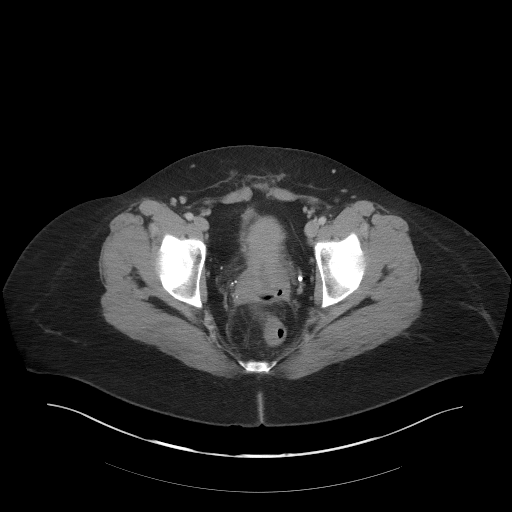
[im 38/106  soft-tissue]
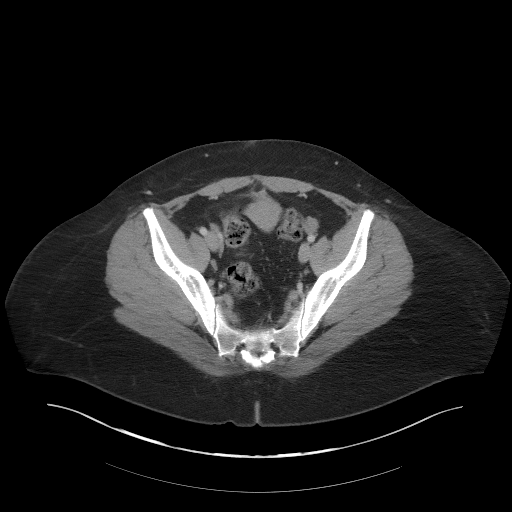
[im 47/106  soft-tissue]
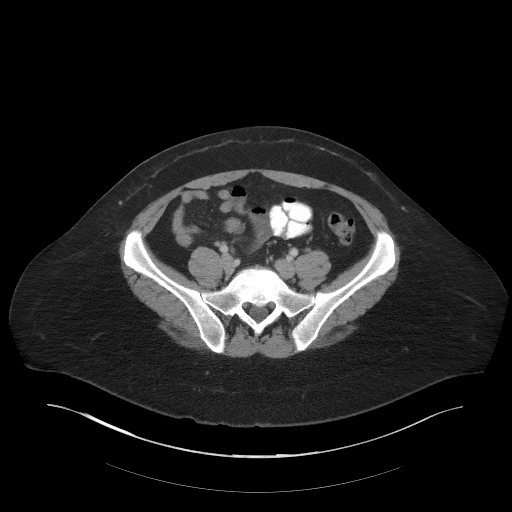
[im 55/106  soft-tissue]
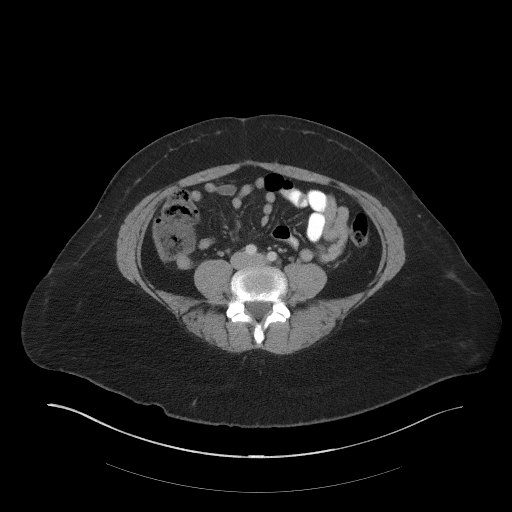
[im 59/106  soft-tissue]
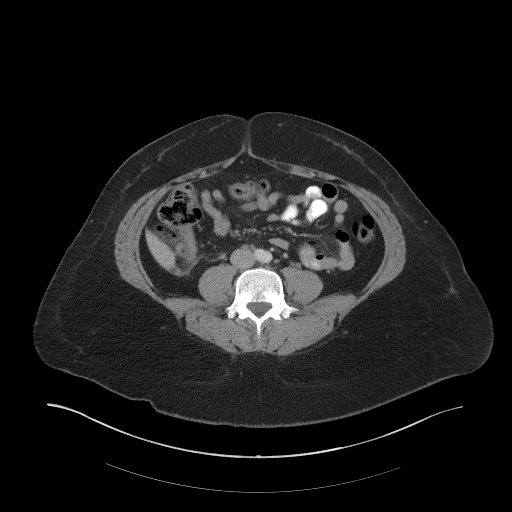
[im 68/106  soft-tissue]
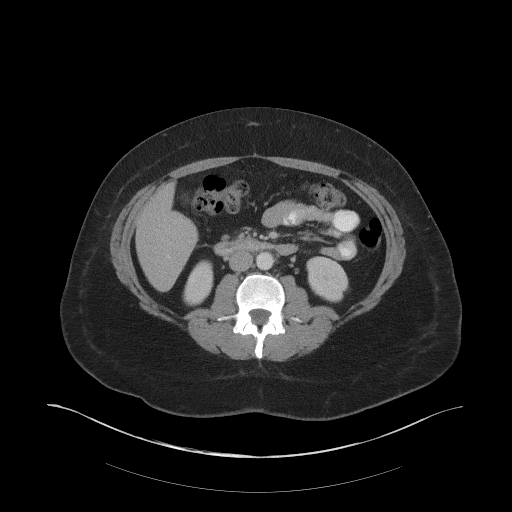
[im 68/106  bone]
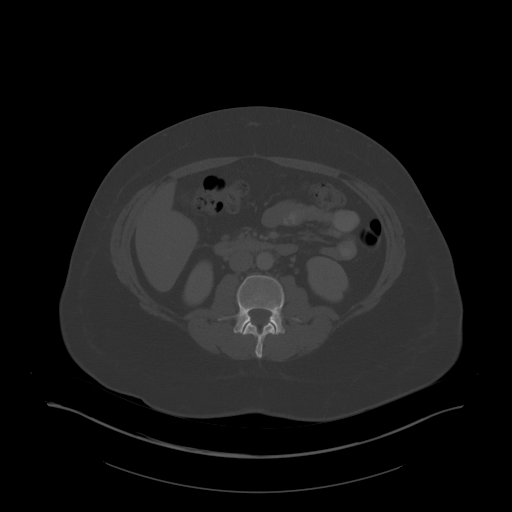
[im 76/106  soft-tissue]
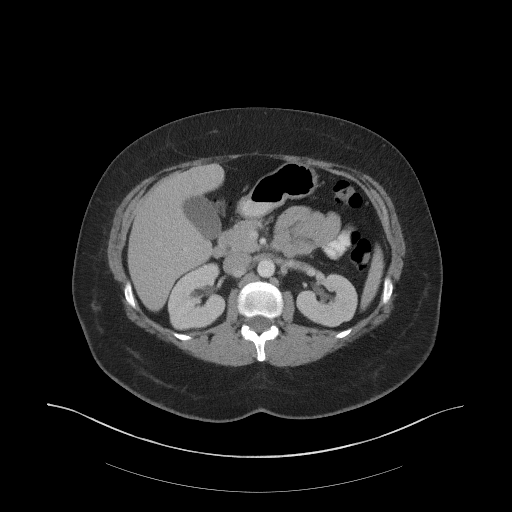
[im 85/106  soft-tissue]
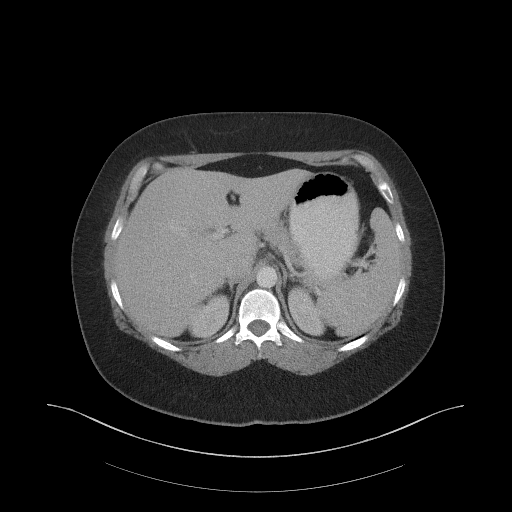
[im 93/106  soft-tissue]
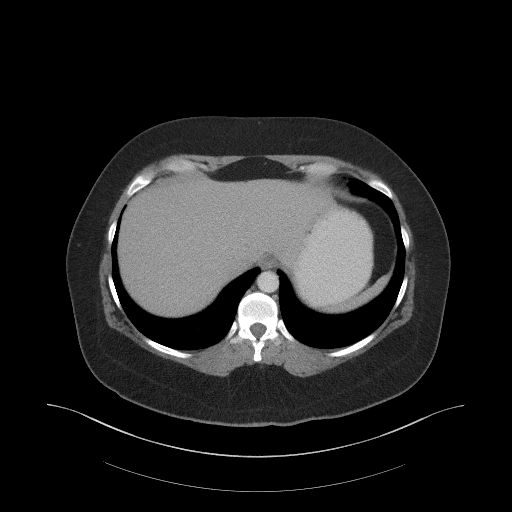
[im 101/106  soft-tissue]
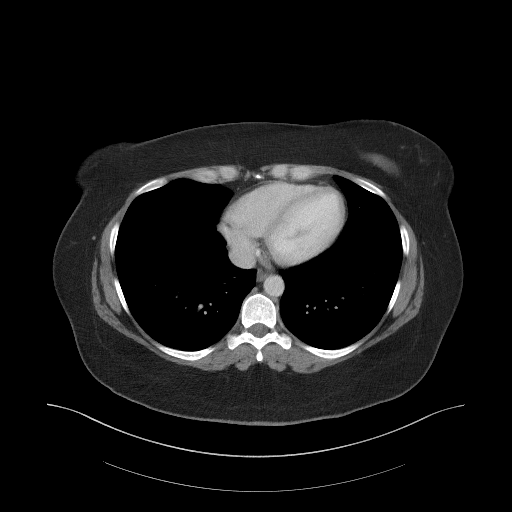

[Series 5: coronal st · coronal · 1.03mm/px · 3 of 106 slices shown]
[im 36/106  soft-tissue]
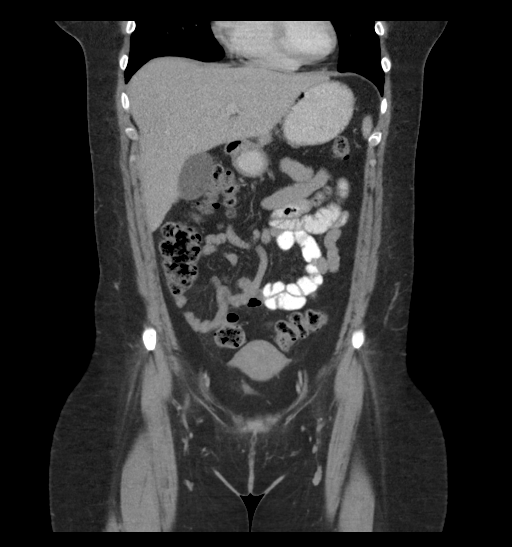
[im 47/106  soft-tissue]
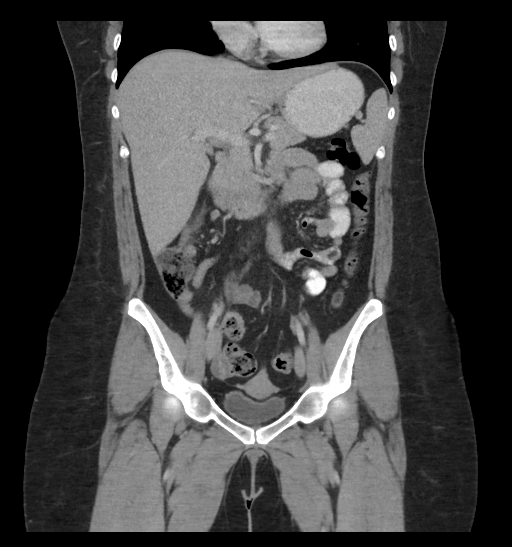
[im 59/106  soft-tissue]
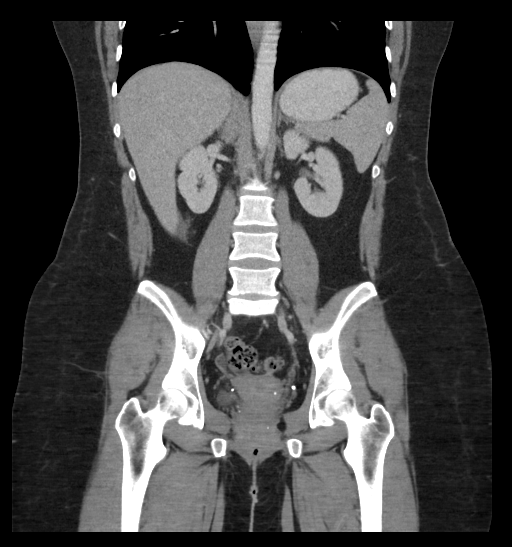

[16 of 46 positions shown; findings below may reference images not displayed]

FINDINGS: Lower chest: No acute abnormality.

Hepatobiliary: No focal liver abnormality is seen. No gallstones,
gallbladder wall thickening, or biliary dilatation.

Pancreas: Unremarkable. No pancreatic ductal dilatation or
surrounding inflammatory changes.

Spleen: Normal in size without focal abnormality.

Adrenals/Urinary Tract: Unremarkable adrenal glands.

Unremarkable appearance the bilateral kidneys with no hydronephrosis
or nephrolithiasis. Unremarkable course the bilateral ureters.
Unremarkable appearance of urinary bladder.

Stomach/Bowel: Unremarkable stomach. Unremarkable small bowel
without abnormal distention. No transition point.

Thickened appendix with inflammatory changes and high density
material within the lumen of the appendix. Note that the appendix is
at the inferior liver margin. No appendicolith. No abscess. No
perforation.

Vascular/Lymphatic: Unremarkable appearance of the abdominal aorta.
No aneurysm or dissection. No periaortic fluid. Small lymph nodes in
the periaortic region and within the ileocolic mesentery.

Reproductive: Unremarkable appearance of the uterus and adnexa

Other: Small fat containing umbilical hernia.

Musculoskeletal: No acute displaced fracture.
IMPRESSION: Acute appendicitis without complicating feature. Note that the
appendix is retrohepatic, adjacent to the posterior right lobe.

These results were called by telephone at the time of interpretation
on 07/15/2017 at [DATE] to Dr. MAHJABIN AWAL , who verbally
acknowledged these results.

## 2020-02-08 DIAGNOSIS — Z3490 Encounter for supervision of normal pregnancy, unspecified, unspecified trimester: Secondary | ICD-10-CM | POA: Insufficient documentation

## 2020-02-10 DIAGNOSIS — Z3481 Encounter for supervision of other normal pregnancy, first trimester: Secondary | ICD-10-CM | POA: Diagnosis not present

## 2020-02-15 DIAGNOSIS — Z3481 Encounter for supervision of other normal pregnancy, first trimester: Secondary | ICD-10-CM | POA: Diagnosis not present

## 2020-02-15 DIAGNOSIS — O26841 Uterine size-date discrepancy, first trimester: Secondary | ICD-10-CM | POA: Diagnosis not present

## 2020-02-15 DIAGNOSIS — O99211 Obesity complicating pregnancy, first trimester: Secondary | ICD-10-CM | POA: Diagnosis not present

## 2020-02-15 DIAGNOSIS — Z3687 Encounter for antenatal screening for uncertain dates: Secondary | ICD-10-CM | POA: Diagnosis not present

## 2020-03-07 DIAGNOSIS — Z3481 Encounter for supervision of other normal pregnancy, first trimester: Secondary | ICD-10-CM | POA: Diagnosis not present

## 2020-03-07 DIAGNOSIS — Z113 Encounter for screening for infections with a predominantly sexual mode of transmission: Secondary | ICD-10-CM | POA: Diagnosis not present

## 2020-04-05 DIAGNOSIS — Z3492 Encounter for supervision of normal pregnancy, unspecified, second trimester: Secondary | ICD-10-CM | POA: Diagnosis not present

## 2020-05-09 DIAGNOSIS — Z363 Encounter for antenatal screening for malformations: Secondary | ICD-10-CM | POA: Diagnosis not present

## 2020-05-09 DIAGNOSIS — Z3492 Encounter for supervision of normal pregnancy, unspecified, second trimester: Secondary | ICD-10-CM | POA: Diagnosis not present

## 2020-05-09 DIAGNOSIS — Z3A19 19 weeks gestation of pregnancy: Secondary | ICD-10-CM | POA: Diagnosis not present

## 2020-05-09 DIAGNOSIS — R42 Dizziness and giddiness: Secondary | ICD-10-CM | POA: Diagnosis not present

## 2020-05-30 DIAGNOSIS — R112 Nausea with vomiting, unspecified: Secondary | ICD-10-CM | POA: Diagnosis not present

## 2020-05-30 DIAGNOSIS — R42 Dizziness and giddiness: Secondary | ICD-10-CM | POA: Diagnosis not present

## 2020-05-30 DIAGNOSIS — Z3A22 22 weeks gestation of pregnancy: Secondary | ICD-10-CM | POA: Diagnosis not present

## 2020-07-10 DIAGNOSIS — Z3402 Encounter for supervision of normal first pregnancy, second trimester: Secondary | ICD-10-CM | POA: Diagnosis not present

## 2020-07-10 DIAGNOSIS — Z3483 Encounter for supervision of other normal pregnancy, third trimester: Secondary | ICD-10-CM | POA: Diagnosis not present

## 2020-07-19 DIAGNOSIS — M9903 Segmental and somatic dysfunction of lumbar region: Secondary | ICD-10-CM | POA: Diagnosis not present

## 2020-07-19 DIAGNOSIS — M5441 Lumbago with sciatica, right side: Secondary | ICD-10-CM | POA: Diagnosis not present

## 2020-07-19 DIAGNOSIS — M5432 Sciatica, left side: Secondary | ICD-10-CM | POA: Diagnosis not present

## 2020-07-19 DIAGNOSIS — M9904 Segmental and somatic dysfunction of sacral region: Secondary | ICD-10-CM | POA: Diagnosis not present

## 2020-07-28 DIAGNOSIS — O34219 Maternal care for unspecified type scar from previous cesarean delivery: Secondary | ICD-10-CM | POA: Diagnosis not present

## 2020-07-28 DIAGNOSIS — M25521 Pain in right elbow: Secondary | ICD-10-CM | POA: Diagnosis not present

## 2020-07-28 DIAGNOSIS — O368130 Decreased fetal movements, third trimester, not applicable or unspecified: Secondary | ICD-10-CM | POA: Diagnosis not present

## 2020-07-28 DIAGNOSIS — O09513 Supervision of elderly primigravida, third trimester: Secondary | ICD-10-CM | POA: Diagnosis not present

## 2020-07-28 DIAGNOSIS — R1012 Left upper quadrant pain: Secondary | ICD-10-CM | POA: Diagnosis not present

## 2020-07-28 DIAGNOSIS — Z9181 History of falling: Secondary | ICD-10-CM | POA: Diagnosis not present

## 2020-07-28 DIAGNOSIS — Z3A30 30 weeks gestation of pregnancy: Secondary | ICD-10-CM | POA: Diagnosis not present

## 2020-07-28 DIAGNOSIS — M25562 Pain in left knee: Secondary | ICD-10-CM | POA: Diagnosis not present

## 2020-07-28 DIAGNOSIS — M25561 Pain in right knee: Secondary | ICD-10-CM | POA: Diagnosis not present

## 2020-07-28 DIAGNOSIS — M25522 Pain in left elbow: Secondary | ICD-10-CM | POA: Diagnosis not present

## 2020-07-28 DIAGNOSIS — F909 Attention-deficit hyperactivity disorder, unspecified type: Secondary | ICD-10-CM | POA: Diagnosis not present

## 2020-07-28 DIAGNOSIS — O99343 Other mental disorders complicating pregnancy, third trimester: Secondary | ICD-10-CM | POA: Diagnosis not present

## 2020-08-21 DIAGNOSIS — Z3483 Encounter for supervision of other normal pregnancy, third trimester: Secondary | ICD-10-CM | POA: Diagnosis not present

## 2020-08-21 DIAGNOSIS — O99019 Anemia complicating pregnancy, unspecified trimester: Secondary | ICD-10-CM | POA: Diagnosis not present

## 2020-08-23 DIAGNOSIS — M9908 Segmental and somatic dysfunction of rib cage: Secondary | ICD-10-CM | POA: Diagnosis not present

## 2020-08-23 DIAGNOSIS — M9901 Segmental and somatic dysfunction of cervical region: Secondary | ICD-10-CM | POA: Diagnosis not present

## 2020-08-23 DIAGNOSIS — M6240 Contracture of muscle, unspecified site: Secondary | ICD-10-CM | POA: Diagnosis not present

## 2020-08-23 DIAGNOSIS — M9902 Segmental and somatic dysfunction of thoracic region: Secondary | ICD-10-CM | POA: Diagnosis not present

## 2020-08-28 DIAGNOSIS — D649 Anemia, unspecified: Secondary | ICD-10-CM | POA: Diagnosis not present

## 2020-09-05 DIAGNOSIS — Z3483 Encounter for supervision of other normal pregnancy, third trimester: Secondary | ICD-10-CM | POA: Diagnosis not present

## 2020-09-05 DIAGNOSIS — Z113 Encounter for screening for infections with a predominantly sexual mode of transmission: Secondary | ICD-10-CM | POA: Diagnosis not present

## 2020-09-05 DIAGNOSIS — Z364 Encounter for antenatal screening for fetal growth retardation: Secondary | ICD-10-CM | POA: Diagnosis not present

## 2020-09-05 DIAGNOSIS — Z3685 Encounter for antenatal screening for Streptococcus B: Secondary | ICD-10-CM | POA: Diagnosis not present

## 2020-09-28 DIAGNOSIS — Z1152 Encounter for screening for COVID-19: Secondary | ICD-10-CM | POA: Diagnosis not present

## 2020-09-30 DIAGNOSIS — O09523 Supervision of elderly multigravida, third trimester: Secondary | ICD-10-CM | POA: Diagnosis not present

## 2020-09-30 DIAGNOSIS — O471 False labor at or after 37 completed weeks of gestation: Secondary | ICD-10-CM | POA: Diagnosis not present

## 2020-09-30 DIAGNOSIS — O34219 Maternal care for unspecified type scar from previous cesarean delivery: Secondary | ICD-10-CM | POA: Diagnosis not present

## 2020-09-30 DIAGNOSIS — O99013 Anemia complicating pregnancy, third trimester: Secondary | ICD-10-CM | POA: Diagnosis not present

## 2020-09-30 DIAGNOSIS — Z3A39 39 weeks gestation of pregnancy: Secondary | ICD-10-CM | POA: Diagnosis not present

## 2020-10-05 DIAGNOSIS — O09523 Supervision of elderly multigravida, third trimester: Secondary | ICD-10-CM | POA: Diagnosis not present

## 2020-10-05 DIAGNOSIS — U071 COVID-19: Secondary | ICD-10-CM | POA: Insufficient documentation

## 2020-10-08 DIAGNOSIS — O471 False labor at or after 37 completed weeks of gestation: Secondary | ICD-10-CM | POA: Diagnosis not present

## 2020-10-08 DIAGNOSIS — Z3A40 40 weeks gestation of pregnancy: Secondary | ICD-10-CM | POA: Diagnosis not present

## 2020-10-09 DIAGNOSIS — Z888 Allergy status to other drugs, medicaments and biological substances status: Secondary | ICD-10-CM | POA: Diagnosis not present

## 2020-10-09 DIAGNOSIS — O99343 Other mental disorders complicating pregnancy, third trimester: Secondary | ICD-10-CM | POA: Diagnosis not present

## 2020-10-09 DIAGNOSIS — O99013 Anemia complicating pregnancy, third trimester: Secondary | ICD-10-CM | POA: Diagnosis not present

## 2020-10-09 DIAGNOSIS — Z2821 Immunization not carried out because of patient refusal: Secondary | ICD-10-CM | POA: Diagnosis not present

## 2020-10-09 DIAGNOSIS — O480 Post-term pregnancy: Secondary | ICD-10-CM | POA: Diagnosis not present

## 2020-10-09 DIAGNOSIS — Z3A41 41 weeks gestation of pregnancy: Secondary | ICD-10-CM | POA: Diagnosis not present

## 2020-10-09 DIAGNOSIS — D649 Anemia, unspecified: Secondary | ICD-10-CM | POA: Diagnosis not present

## 2020-10-09 DIAGNOSIS — O9852 Other viral diseases complicating childbirth: Secondary | ICD-10-CM | POA: Diagnosis not present

## 2020-10-09 DIAGNOSIS — Z2882 Immunization not carried out because of caregiver refusal: Secondary | ICD-10-CM | POA: Diagnosis not present

## 2020-10-09 DIAGNOSIS — Z9882 Breast implant status: Secondary | ICD-10-CM | POA: Diagnosis not present

## 2020-10-09 DIAGNOSIS — U071 COVID-19: Secondary | ICD-10-CM | POA: Diagnosis not present

## 2020-10-09 DIAGNOSIS — O26893 Other specified pregnancy related conditions, third trimester: Secondary | ICD-10-CM | POA: Diagnosis not present

## 2020-10-09 DIAGNOSIS — Z9189 Other specified personal risk factors, not elsewhere classified: Secondary | ICD-10-CM | POA: Diagnosis not present

## 2020-10-09 DIAGNOSIS — O34211 Maternal care for low transverse scar from previous cesarean delivery: Secondary | ICD-10-CM | POA: Diagnosis not present

## 2020-10-09 DIAGNOSIS — R769 Abnormal immunological finding in serum, unspecified: Secondary | ICD-10-CM | POA: Diagnosis not present

## 2020-10-09 DIAGNOSIS — N736 Female pelvic peritoneal adhesions (postinfective): Secondary | ICD-10-CM | POA: Diagnosis not present

## 2020-10-09 DIAGNOSIS — O99892 Other specified diseases and conditions complicating childbirth: Secondary | ICD-10-CM | POA: Diagnosis not present

## 2020-10-09 DIAGNOSIS — E739 Lactose intolerance, unspecified: Secondary | ICD-10-CM | POA: Diagnosis not present

## 2020-11-14 DIAGNOSIS — O9903 Anemia complicating the puerperium: Secondary | ICD-10-CM | POA: Diagnosis not present

## 2021-02-04 DIAGNOSIS — T7840XA Allergy, unspecified, initial encounter: Secondary | ICD-10-CM | POA: Diagnosis not present

## 2021-03-14 ENCOUNTER — Encounter: Payer: Self-pay | Admitting: *Deleted

## 2021-07-09 ENCOUNTER — Encounter: Payer: Self-pay | Admitting: Family Medicine

## 2021-07-09 ENCOUNTER — Ambulatory Visit (INDEPENDENT_AMBULATORY_CARE_PROVIDER_SITE_OTHER): Payer: BC Managed Care – PPO | Admitting: Family Medicine

## 2021-07-09 ENCOUNTER — Other Ambulatory Visit: Payer: Self-pay

## 2021-07-09 VITALS — BP 118/78 | HR 72 | Temp 98.4°F | Resp 17 | Wt 237.6 lb

## 2021-07-09 DIAGNOSIS — E669 Obesity, unspecified: Secondary | ICD-10-CM | POA: Diagnosis not present

## 2021-07-09 DIAGNOSIS — F3181 Bipolar II disorder: Secondary | ICD-10-CM

## 2021-07-09 DIAGNOSIS — F909 Attention-deficit hyperactivity disorder, unspecified type: Secondary | ICD-10-CM

## 2021-07-09 DIAGNOSIS — L918 Other hypertrophic disorders of the skin: Secondary | ICD-10-CM

## 2021-07-09 LAB — CBC WITH DIFFERENTIAL/PLATELET
Basophils Absolute: 0.1 10*3/uL (ref 0.0–0.1)
Basophils Relative: 1.1 % (ref 0.0–3.0)
Eosinophils Absolute: 0.1 10*3/uL (ref 0.0–0.7)
Eosinophils Relative: 1.1 % (ref 0.0–5.0)
HCT: 37.3 % (ref 36.0–46.0)
Hemoglobin: 12.3 g/dL (ref 12.0–15.0)
Lymphocytes Relative: 32.6 % (ref 12.0–46.0)
Lymphs Abs: 2.7 10*3/uL (ref 0.7–4.0)
MCHC: 33 g/dL (ref 30.0–36.0)
MCV: 92 fl (ref 78.0–100.0)
Monocytes Absolute: 0.2 10*3/uL (ref 0.1–1.0)
Monocytes Relative: 2.7 % — ABNORMAL LOW (ref 3.0–12.0)
Neutro Abs: 5.1 10*3/uL (ref 1.4–7.7)
Neutrophils Relative %: 62.5 % (ref 43.0–77.0)
Platelets: 419 10*3/uL — ABNORMAL HIGH (ref 150.0–400.0)
RBC: 4.05 Mil/uL (ref 3.87–5.11)
RDW: 12.3 % (ref 11.5–15.5)
WBC: 8.1 10*3/uL (ref 4.0–10.5)

## 2021-07-09 NOTE — Assessment & Plan Note (Signed)
Ongoing issue.  Pt has gained 15 lbs since last visit 2020.  Did have a baby in January.  Working to lose the weight.  Stressed the need for healthy diet and regular exercise.  Check labs to risk stratify.  Will follow.

## 2021-07-09 NOTE — Progress Notes (Signed)
   Subjective:    Patient ID: Andrea [name], female    DOB: [birth year], 40 y.o.   MRN: [id number]  HPI ADHD- not currently on medication.  Previously on Adderall XR 10mg .  She felt she was more calm and less anxious when on Adderall.  Stopped breastfeeding 2 months ago.  Bipolar II- not currently on medication.  Feels mood and anxiety have improved when compared to previous pregnancy.  Has eliminated news consumption to improve stressors.  Has not had recent hypomanic episodes that prompted her to take off.  Obesity- pt has gained 15 lbs since 2020.  Had baby late January 2022.  Spots on my back- 'open sores'.  Husband noticed this summer but areas are not improving.    Skin tag- R side of neck.  New and enlarging.  Review of Systems For ROS see HPI     Objective:   Physical Exam Vitals reviewed.  Constitutional:      General: She is not in acute distress.    Appearance: She is well-developed. She is obese. She is not ill-appearing.  HENT:     Head: Normocephalic and atraumatic.  Eyes:     Conjunctiva/sclera: Conjunctivae normal.     Pupils: Pupils are equal, round, and reactive to light.  Neck:     Thyroid: No thyromegaly.  Cardiovascular:     Rate and Rhythm: Normal rate and regular rhythm.     Pulses: Normal pulses.     Heart sounds: Normal heart sounds. No murmur heard. Pulmonary:     Effort: Pulmonary effort is normal. No respiratory distress.     Breath sounds: Normal breath sounds.  Abdominal:     General: There is no distension.     Palpations: Abdomen is soft.     Tenderness: There is no abdominal tenderness.  Musculoskeletal:     Cervical back: Normal range of motion and neck supple.     Right lower leg: No edema.     Left lower leg: No edema.  Lymphadenopathy:     Cervical: No cervical adenopathy.  Skin:    General: Skin is warm and dry.     Comments: 2 small scars on L back Skin tag on R lateral neck  Neurological:     Mental Status: She is alert and  oriented to person, place, and time.  Psychiatric:        Mood and Affect: Mood normal.        Behavior: Behavior normal.        Thought Content: Thought content normal.          Assessment & Plan:   Skin tag- new.  Reassurance provided that these are benign and can be hormonally responsive.  She will wait a few months now that she is done breast feeding and then decide whether she desires excision or not.

## 2021-07-09 NOTE — Assessment & Plan Note (Signed)
Pt admits she does function better on Adderall but would like to stay off meds if possible.  She is no longer breastfeeding in case we need to restart.  Will follow.

## 2021-07-09 NOTE — Assessment & Plan Note (Signed)
Psychiatrist has retired.  Not currently on medication.  Feels mood is stable.  Is hoping to remain off medication.  Will continue to follow.

## 2021-07-09 NOTE — Patient Instructions (Signed)
Schedule your complete physical in 6 months We'll notify you of your lab results and make any changes if needed Continue to work on healthy diet and regular exercise- you can do it! Let me know about the Adderall- we can restart whenever The spots on the back are now scars- nothing to worry about Monitor the skin tag for a few months and see if it changes again (hormonally) Call with any questions or concerns Stay Safe!  Stay Healthy!

## 2021-07-10 LAB — TSH: TSH: 1.25 u[IU]/mL (ref 0.35–5.50)

## 2021-07-10 LAB — LIPID PANEL
Cholesterol: 159 mg/dL (ref 0–200)
HDL: 45.8 mg/dL (ref >39.00)
LDL Cholesterol: 84 mg/dL (ref 0–99)
NonHDL: 113.61
Total CHOL/HDL Ratio: 3
Triglycerides: 147 mg/dL (ref 0.0–149.0)
VLDL: 29.4 mg/dL (ref 0.0–40.0)

## 2021-07-10 LAB — BASIC METABOLIC PANEL
BUN: 9 mg/dL (ref 6–23)
CO2: 25 mEq/L (ref 19–32)
Calcium: 9.7 mg/dL (ref 8.4–10.5)
Chloride: 104 mEq/L (ref 96–112)
Creatinine, Ser: 0.9 mg/dL (ref 0.40–1.20)
GFR: 80.59 mL/min (ref >60.00)
Glucose, Bld: 91 mg/dL (ref 70–99)
Potassium: 4.2 mEq/L (ref 3.5–5.1)
Sodium: 139 mEq/L (ref 135–145)

## 2021-07-10 LAB — HEPATIC FUNCTION PANEL
ALT: 21 U/L (ref 0–35)
AST: 14 U/L (ref 0–37)
Albumin: 4.7 g/dL (ref 3.5–5.2)
Alkaline Phosphatase: 71 U/L (ref 39–117)
Bilirubin, Direct: 0.1 mg/dL (ref 0.0–0.3)
Total Bilirubin: 0.4 mg/dL (ref 0.2–1.2)
Total Protein: 8 g/dL (ref 6.0–8.3)

## 2021-07-10 LAB — VITAMIN D 25 HYDROXY (VIT D DEFICIENCY, FRACTURES): VITD: 18.26 ng/mL — ABNORMAL LOW (ref 30.00–100.00)

## 2021-07-11 ENCOUNTER — Other Ambulatory Visit: Payer: Self-pay

## 2021-07-11 DIAGNOSIS — E559 Vitamin D deficiency, unspecified: Secondary | ICD-10-CM

## 2021-07-11 MED ORDER — VITAMIN D (ERGOCALCIFEROL) 1.25 MG (50000 UNIT) PO CAPS: 50000.0000 [IU] | ORAL_CAPSULE | ORAL | 0 refills | Status: DC

## 2022-01-07 ENCOUNTER — Ambulatory Visit (INDEPENDENT_AMBULATORY_CARE_PROVIDER_SITE_OTHER): Payer: BC Managed Care – PPO | Admitting: Family Medicine

## 2022-01-07 ENCOUNTER — Encounter: Payer: Self-pay | Admitting: Family Medicine

## 2022-01-07 VITALS — BP 110/70 | HR 69 | Temp 97.7°F | Resp 16 | Ht 64.0 in | Wt 244.8 lb

## 2022-01-07 DIAGNOSIS — Z0000 Encounter for general adult medical examination without abnormal findings: Secondary | ICD-10-CM

## 2022-01-07 DIAGNOSIS — L237 Allergic contact dermatitis due to plants, except food: Secondary | ICD-10-CM | POA: Insufficient documentation

## 2022-01-07 DIAGNOSIS — T7840XA Allergy, unspecified, initial encounter: Secondary | ICD-10-CM | POA: Insufficient documentation

## 2022-01-07 DIAGNOSIS — D225 Melanocytic nevi of trunk: Secondary | ICD-10-CM | POA: Insufficient documentation

## 2022-01-07 LAB — LIPID PANEL
Cholesterol: 145 mg/dL (ref 0–200)
HDL: 53.9 mg/dL (ref >39.00)
LDL Cholesterol: 70 mg/dL (ref 0–99)
NonHDL: 91.36
Total CHOL/HDL Ratio: 3
Triglycerides: 109 mg/dL (ref 0.0–149.0)
VLDL: 21.8 mg/dL (ref 0.0–40.0)

## 2022-01-07 LAB — HEPATIC FUNCTION PANEL
ALT: 30 U/L (ref 0–35)
AST: 19 U/L (ref 0–37)
Albumin: 4.6 g/dL (ref 3.5–5.2)
Alkaline Phosphatase: 61 U/L (ref 39–117)
Bilirubin, Direct: 0.1 mg/dL (ref 0.0–0.3)
Total Bilirubin: 0.7 mg/dL (ref 0.2–1.2)
Total Protein: 7.9 g/dL (ref 6.0–8.3)

## 2022-01-07 LAB — CBC WITH DIFFERENTIAL/PLATELET
Basophils Absolute: 0 10*3/uL (ref 0.0–0.1)
Basophils Relative: 0.6 % (ref 0.0–3.0)
Eosinophils Absolute: 0.1 10*3/uL (ref 0.0–0.7)
Eosinophils Relative: 2.4 % (ref 0.0–5.0)
HCT: 38.9 % (ref 36.0–46.0)
Hemoglobin: 12.9 g/dL (ref 12.0–15.0)
Lymphocytes Relative: 40.9 % (ref 12.0–46.0)
Lymphs Abs: 2.2 10*3/uL (ref 0.7–4.0)
MCHC: 33.2 g/dL (ref 30.0–36.0)
MCV: 94.9 fl (ref 78.0–100.0)
Monocytes Absolute: 0.3 10*3/uL (ref 0.1–1.0)
Monocytes Relative: 6.2 % (ref 3.0–12.0)
Neutro Abs: 2.7 10*3/uL (ref 1.4–7.7)
Neutrophils Relative %: 49.9 % (ref 43.0–77.0)
Platelets: 304 10*3/uL (ref 150.0–400.0)
RBC: 4.1 Mil/uL (ref 3.87–5.11)
RDW: 13.7 % (ref 11.5–15.5)
WBC: 5.4 10*3/uL (ref 4.0–10.5)

## 2022-01-07 LAB — BASIC METABOLIC PANEL
BUN: 8 mg/dL (ref 6–23)
CO2: 20 mEq/L (ref 19–32)
Calcium: 9.2 mg/dL (ref 8.4–10.5)
Chloride: 103 mEq/L (ref 96–112)
Creatinine, Ser: 0.62 mg/dL (ref 0.40–1.20)
GFR: 111.81 mL/min (ref >60.00)
Glucose, Bld: 70 mg/dL (ref 70–99)
Potassium: 4.4 mEq/L (ref 3.5–5.1)
Sodium: 135 mEq/L (ref 135–145)

## 2022-01-07 LAB — TSH: TSH: 2.02 u[IU]/mL (ref 0.35–5.50)

## 2022-01-07 LAB — VITAMIN D 25 HYDROXY (VIT D DEFICIENCY, FRACTURES): VITD: 21.98 ng/mL — ABNORMAL LOW (ref 30.00–100.00)

## 2022-01-07 NOTE — Assessment & Plan Note (Signed)
Ongoing issue for pt.  BMI now 42.02.  She is not currently on her Adderall so she doesn't have the benefit of appetite suppression.  And she knows she has been stress eating w/ the recent dx of Autism for her middle child.  Encouraged healthy diet and regular physical activity.  Check labs to risk stratify.  Will follow. ?

## 2022-01-07 NOTE — Patient Instructions (Addendum)
Follow up in 1 year or as needed ?We'll notify you of your lab results and make any changes if needed ?Make sure you are taking care of yourself during all of this!!! ?Continue to work on healthy diet and regular exercise- you can do it! ?Call with any questions or concerns ?Hang in there!!!  You're doing great!! ?

## 2022-01-07 NOTE — Assessment & Plan Note (Signed)
Pt's PE WNL w/ exception of obesity.  UTD on pap, Tdap.  Due to start mammogram later this year.  Check labs.  Anticipatory guidance provided.  ?

## 2022-01-07 NOTE — Progress Notes (Signed)
? ?  Subjective:  ? ? Patient ID: Evolette Pendell, female    DOB: [birth year], 41 y.o.   MRN: [id number] ? ?HPI ?CPE- pt reports UTD on pap (attempting to get records from [place]).  UTD on Tdap ? ?Health Maintenance  ?Topic Date Due  ? PAP SMEAR-Modifier  10/29/2021  ? INFLUENZA VACCINE  04/16/2022  ? TETANUS/TDAP  04/25/2027  ? HIV Screening  Completed  ? HPV VACCINES  Aged Out  ? COVID-19 Vaccine  Discontinued  ? Hepatitis C Screening  Discontinued  ?  ? ? ?Review of Systems ?Patient reports no vision/ hearing changes, adenopathy,fever, persistant/recurrent hoarseness , swallowing issues, chest pain, palpitations, edema, persistant/recurrent cough, hemoptysis, dyspnea (rest/exertional/paroxysmal nocturnal), gastrointestinal bleeding (melena, rectal bleeding), abdominal pain, significant heartburn, bowel changes, GU symptoms (dysuria, hematuria, incontinence), Gyn symptoms (abnormal  bleeding, pain),  syncope, focal weakness, memory loss, numbness & tingling, skin/hair/nail changes, abnormal bruising or bleeding.  ? ?+ 7 lb weight gain ?+ stress- middle daughter (age 25) just dx'd w/ Autism ?   ?Objective:  ? Physical Exam ?General Appearance:    Alert, cooperative, no distress, appears stated age, obese  ?Head:    Normocephalic, without obvious abnormality, atraumatic  ?Eyes:    PERRL, conjunctiva/corneas clear, EOM's intact both eyes  ?Ears:    Normal TM's and external ear canals, both ears  ?Nose:   Nares normal, septum midline, mucosa normal, no drainage  ?  or sinus tenderness  ?Throat:   Lips, mucosa, and tongue normal; teeth and gums normal  ?Neck:   Supple, symmetrical, trachea midline, no adenopathy;  ?  Thyroid: no enlargement/tenderness/nodules  ?Back:     Symmetric, no curvature, ROM normal, no CVA tenderness  ?Lungs:     Clear to auscultation bilaterally, respirations unlabored  ?Chest Wall:    No tenderness or deformity  ? Heart:    Regular rate and rhythm, S1 and S2 normal, no murmur, rub ?  or gallop   ?Breast Exam:    Deferred to GYN  ?Abdomen:     Soft, non-tender, bowel sounds active all four quadrants,  ?  no masses, no organomegaly  ?Genitalia:    Deferred to GYN  ?Rectal:    ?Extremities:   Extremities normal, atraumatic, no cyanosis or edema  ?Pulses:   2+ and symmetric all extremities  ?Skin:   Skin color, texture, turgor normal, no rashes or lesions  ?Lymph nodes:   Cervical, supraclavicular, and axillary nodes normal  ?Neurologic:   CNII-XII intact, normal strength, sensation and reflexes  ?  throughout  ?  ? ? ? ?   ?Assessment & Plan:  ? ? ?

## 2022-01-08 ENCOUNTER — Telehealth: Payer: Self-pay

## 2022-01-08 ENCOUNTER — Other Ambulatory Visit: Payer: Self-pay

## 2022-01-08 DIAGNOSIS — E559 Vitamin D deficiency, unspecified: Secondary | ICD-10-CM

## 2022-01-08 MED ORDER — VITAMIN D (ERGOCALCIFEROL) 1.25 MG (50000 UNIT) PO CAPS: 50000.0000 [IU] | ORAL_CAPSULE | ORAL | 0 refills | Status: DC

## 2022-01-08 NOTE — Telephone Encounter (Signed)
-----   Message from Midge Minium, MD sent at 01/08/2022  7:53 AM EDT ----- ?Labs look great w/ exception of low Vit D.  Based on this, we need to start prescription 50,000 units weekly x12 weeks in addition to daily OTC supplement of at least 2000 units.  ?

## 2022-01-08 NOTE — Telephone Encounter (Signed)
Patient aware of labs and vitamin d rx ?

## 2022-01-10 ENCOUNTER — Encounter: Payer: Self-pay | Admitting: Family Medicine

## 2022-01-11 NOTE — Telephone Encounter (Signed)
Pt is asking about safety of vitamin D with pregnancy, should confirm with OB if pregnant then discuss supplementation ? ?

## 2022-01-14 NOTE — Telephone Encounter (Signed)
error 

## 2022-01-17 DIAGNOSIS — O30041 Twin pregnancy, dichorionic/diamniotic, first trimester: Secondary | ICD-10-CM | POA: Diagnosis not present

## 2022-01-17 DIAGNOSIS — Z3A01 Less than 8 weeks gestation of pregnancy: Secondary | ICD-10-CM | POA: Diagnosis not present

## 2022-01-31 DIAGNOSIS — Z3481 Encounter for supervision of other normal pregnancy, first trimester: Secondary | ICD-10-CM | POA: Diagnosis not present

## 2022-01-31 DIAGNOSIS — Z3A08 8 weeks gestation of pregnancy: Secondary | ICD-10-CM | POA: Diagnosis not present

## 2022-01-31 DIAGNOSIS — O99211 Obesity complicating pregnancy, first trimester: Secondary | ICD-10-CM | POA: Diagnosis not present

## 2022-01-31 DIAGNOSIS — Z114 Encounter for screening for human immunodeficiency virus [HIV]: Secondary | ICD-10-CM | POA: Diagnosis not present

## 2022-01-31 DIAGNOSIS — O3680X1 Pregnancy with inconclusive fetal viability, fetus 1: Secondary | ICD-10-CM | POA: Diagnosis not present

## 2022-02-15 DIAGNOSIS — O09521 Supervision of elderly multigravida, first trimester: Secondary | ICD-10-CM | POA: Diagnosis not present

## 2022-02-26 DIAGNOSIS — Z113 Encounter for screening for infections with a predominantly sexual mode of transmission: Secondary | ICD-10-CM | POA: Diagnosis not present

## 2022-02-26 DIAGNOSIS — Z3481 Encounter for supervision of other normal pregnancy, first trimester: Secondary | ICD-10-CM | POA: Diagnosis not present

## 2022-02-26 DIAGNOSIS — O3680X1 Pregnancy with inconclusive fetal viability, fetus 1: Secondary | ICD-10-CM | POA: Diagnosis not present

## 2022-02-26 DIAGNOSIS — Z3A11 11 weeks gestation of pregnancy: Secondary | ICD-10-CM | POA: Diagnosis not present

## 2022-02-27 ENCOUNTER — Other Ambulatory Visit: Payer: Self-pay

## 2022-02-27 ENCOUNTER — Encounter (HOSPITAL_BASED_OUTPATIENT_CLINIC_OR_DEPARTMENT_OTHER): Payer: Self-pay | Admitting: Emergency Medicine

## 2022-02-27 ENCOUNTER — Emergency Department (HOSPITAL_BASED_OUTPATIENT_CLINIC_OR_DEPARTMENT_OTHER)
Admission: EM | Admit: 2022-02-27 | Discharge: 2022-02-28 | Disposition: A | Discharge status: Home / Self Care | Payer: BC Managed Care – PPO | Attending: Emergency Medicine | Admitting: Emergency Medicine

## 2022-02-27 DIAGNOSIS — O26891 Other specified pregnancy related conditions, first trimester: Secondary | ICD-10-CM | POA: Insufficient documentation

## 2022-02-27 DIAGNOSIS — R109 Unspecified abdominal pain: Secondary | ICD-10-CM | POA: Diagnosis not present

## 2022-02-27 DIAGNOSIS — Z3A12 12 weeks gestation of pregnancy: Secondary | ICD-10-CM | POA: Diagnosis not present

## 2022-02-27 LAB — URINALYSIS, ROUTINE W REFLEX MICROSCOPIC
Bilirubin Urine: NEGATIVE
Glucose, UA: NEGATIVE mg/dL
Hgb urine dipstick: NEGATIVE
Ketones, ur: NEGATIVE mg/dL
Leukocytes,Ua: NEGATIVE
Nitrite: NEGATIVE
Protein, ur: NEGATIVE mg/dL
Specific Gravity, Urine: 1.005 (ref 1.005–1.030)
pH: 6.5 (ref 5.0–8.0)

## 2022-02-27 NOTE — ED Triage Notes (Signed)
Pt c/o bilateral flank pain radiating into the abdomen onset today at 11 am. Has had foul urine, decreased urine output, and urinary output about 3 weeks ago. Symptoms had some improvement but worsened 3 days. Was on ABX (keflex) 3 weeks ago. Took 500 mg tylenol at 9 pm today with some improvement.   Per note pregnancy, currently 12 weeks. This pregnancy complicated with loss of a twin two weeks ago. Last OBGYN yesterday. This is pt's fourth pregnancy. Denies vaginal discharge/bleeding.

## 2022-02-28 NOTE — Discharge Instructions (Signed)
Continue taking Tylenol 1000 mg every 6 hours as needed for pain.  Follow-up with your OB tomorrow for a recheck, and to go to the maternal admitting unit if you develop worsening pain, bleeding, high fevers, or other new and concerning symptoms.

## 2022-02-28 NOTE — ED Provider Notes (Signed)
[facility] EMERGENC[facility] DEPT Provider Note   CSN: [id number] Arrival date & time: 02/27/22  2216     History  Chief Complaint  Patient presents with   Flank Pain    Andrea [name] is a 41 y.o. female.  Patient is a 41 year old female G4 P3003 at approximately [redacted] weeks gestation.  Patient presenting today with complaints of bilateral flank pain.  She was seen by her OB/GYN yesterday and had an ultrasound showing fetal heartbeat and good fetal movement with no complicated findings.  This pregnancy was previously a twin pregnancy, but there was loss of 1 fetus.  She denies to me she is having any vaginal bleeding, lower abdominal pain, or bowel or bladder complaints.  She has been taking Tylenol at home with some relief.  The history is provided by the patient.       Home Medications Prior to Admission medications   Medication Sig Start Date End Date Taking? Authorizing Provider  Vitamin D, Ergocalciferol, (DRISDOL) 1.25 MG (50000 UNIT) CAPS capsule Take 1 capsule (50,000 Units total) by mouth every 7 (seven) days. 01/08/22   Midge Minium, MD      Allergies    Feraheme [ferumoxytol], Dairy aid [tilactase], and Lamictal [lamotrigine]    Review of Systems   Review of Systems  All other systems reviewed and are negative.   Physical Exam Updated Vital Signs BP 115/76 (BP Location: Right Arm)   Pulse 74   Temp 98.1 F (36.7 C)   Resp 18   Ht '5\' 4"'$  (1.626 m)   Wt 111.1 kg   SpO2 100%   BMI 42.05 kg/m  Physical Exam Vitals and nursing note reviewed.  Constitutional:      General: She is not in acute distress.    Appearance: She is well-developed. She is not diaphoretic.  HENT:     Head: Normocephalic and atraumatic.  Cardiovascular:     Rate and Rhythm: Normal rate and regular rhythm.     Heart sounds: No murmur heard.    No friction rub. No gallop.  Pulmonary:     Effort: Pulmonary effort is normal. No respiratory distress.     Breath sounds:  Normal breath sounds. No wheezing.  Abdominal:     General: Bowel sounds are normal. There is no distension.     Palpations: Abdomen is soft.     Tenderness: There is no abdominal tenderness.  Musculoskeletal:        General: Normal range of motion.     Cervical back: Normal range of motion and neck supple.  Skin:    General: Skin is warm and dry.  Neurological:     General: No focal deficit present.     Mental Status: She is alert and oriented to person, place, and time.     ED Results / Procedures / Treatments   Labs (all labs ordered are listed, but only abnormal results are displayed) Labs Reviewed  URINALYSIS, ROUTINE W REFLEX MICROSCOPIC    EKG None  Radiology No results found.  Procedures Procedures    Medications Ordered in ED Medications - No data to display  ED Course/ Medical Decision Making/ A&P  Patient presenting with flank pain as described in the HPI.  She is pregnant at approximately [redacted] weeks gestation.  She had an ultrasound yesterday by her OB/GYN that was reassuring.  She apparently started with this pain earlier today in the absence of any injury or trauma.  Patient's urinalysis today is clear.  I  am uncertain as to the cause of her discomfort, but suspect a musculoskeletal etiology.  She is not having any vaginal bleeding and has a documented IUP.  At this point, I think she can safely be discharged.  She tells me she will follow-up with her OB today at 84.  Final Clinical Impression(s) / ED Diagnoses Final diagnoses:  None    Rx / DC Orders ED Discharge Orders     None         Veryl Speak, MD 02/28/22 0139

## 2022-04-22 DIAGNOSIS — Z363 Encounter for antenatal screening for malformations: Secondary | ICD-10-CM | POA: Diagnosis not present

## 2022-04-26 DIAGNOSIS — R769 Abnormal immunological finding in serum, unspecified: Secondary | ICD-10-CM | POA: Diagnosis not present

## 2022-06-17 DIAGNOSIS — Z3492 Encounter for supervision of normal pregnancy, unspecified, second trimester: Secondary | ICD-10-CM | POA: Diagnosis not present

## 2022-06-24 DIAGNOSIS — R112 Nausea with vomiting, unspecified: Secondary | ICD-10-CM | POA: Diagnosis not present

## 2022-06-24 DIAGNOSIS — Z3A28 28 weeks gestation of pregnancy: Secondary | ICD-10-CM | POA: Diagnosis not present

## 2022-06-24 DIAGNOSIS — O99013 Anemia complicating pregnancy, third trimester: Secondary | ICD-10-CM | POA: Diagnosis not present

## 2022-06-27 DIAGNOSIS — D649 Anemia, unspecified: Secondary | ICD-10-CM | POA: Diagnosis not present

## 2022-06-27 DIAGNOSIS — O99019 Anemia complicating pregnancy, unspecified trimester: Secondary | ICD-10-CM | POA: Diagnosis not present

## 2022-06-27 DIAGNOSIS — R42 Dizziness and giddiness: Secondary | ICD-10-CM | POA: Diagnosis not present

## 2022-06-27 DIAGNOSIS — Z3A29 29 weeks gestation of pregnancy: Secondary | ICD-10-CM | POA: Diagnosis not present

## 2022-06-27 DIAGNOSIS — O26893 Other specified pregnancy related conditions, third trimester: Secondary | ICD-10-CM | POA: Diagnosis not present

## 2022-06-27 DIAGNOSIS — O99013 Anemia complicating pregnancy, third trimester: Secondary | ICD-10-CM | POA: Diagnosis not present

## 2022-07-01 DIAGNOSIS — O99019 Anemia complicating pregnancy, unspecified trimester: Secondary | ICD-10-CM | POA: Diagnosis not present

## 2022-07-02 DIAGNOSIS — R102 Pelvic and perineal pain: Secondary | ICD-10-CM | POA: Diagnosis not present

## 2022-07-02 DIAGNOSIS — Z364 Encounter for antenatal screening for fetal growth retardation: Secondary | ICD-10-CM | POA: Diagnosis not present

## 2022-07-02 DIAGNOSIS — O99213 Obesity complicating pregnancy, third trimester: Secondary | ICD-10-CM | POA: Diagnosis not present

## 2022-07-02 DIAGNOSIS — Z3A29 29 weeks gestation of pregnancy: Secondary | ICD-10-CM | POA: Diagnosis not present

## 2022-07-10 DIAGNOSIS — D649 Anemia, unspecified: Secondary | ICD-10-CM | POA: Diagnosis not present

## 2022-07-25 DIAGNOSIS — D649 Anemia, unspecified: Secondary | ICD-10-CM | POA: Diagnosis not present

## 2022-08-02 DIAGNOSIS — R300 Dysuria: Secondary | ICD-10-CM | POA: Diagnosis not present

## 2022-08-02 DIAGNOSIS — Z3A34 34 weeks gestation of pregnancy: Secondary | ICD-10-CM | POA: Diagnosis not present

## 2022-08-06 DIAGNOSIS — O99213 Obesity complicating pregnancy, third trimester: Secondary | ICD-10-CM | POA: Diagnosis not present

## 2022-08-06 DIAGNOSIS — O09523 Supervision of elderly multigravida, third trimester: Secondary | ICD-10-CM | POA: Diagnosis not present

## 2022-08-07 DIAGNOSIS — O2303 Infections of kidney in pregnancy, third trimester: Secondary | ICD-10-CM | POA: Diagnosis not present

## 2022-08-07 DIAGNOSIS — Z3A34 34 weeks gestation of pregnancy: Secondary | ICD-10-CM | POA: Diagnosis not present

## 2022-08-07 DIAGNOSIS — Z888 Allergy status to other drugs, medicaments and biological substances status: Secondary | ICD-10-CM | POA: Diagnosis not present

## 2022-08-07 DIAGNOSIS — Z87892 Personal history of anaphylaxis: Secondary | ICD-10-CM | POA: Diagnosis not present

## 2022-08-07 DIAGNOSIS — Z91011 Allergy to milk products: Secondary | ICD-10-CM | POA: Diagnosis not present

## 2022-08-07 DIAGNOSIS — K760 Fatty (change of) liver, not elsewhere classified: Secondary | ICD-10-CM | POA: Diagnosis not present

## 2022-08-07 DIAGNOSIS — N12 Tubulo-interstitial nephritis, not specified as acute or chronic: Secondary | ICD-10-CM | POA: Diagnosis not present

## 2022-08-07 DIAGNOSIS — O26613 Liver and biliary tract disorders in pregnancy, third trimester: Secondary | ICD-10-CM | POA: Diagnosis not present

## 2022-08-07 DIAGNOSIS — O26893 Other specified pregnancy related conditions, third trimester: Secondary | ICD-10-CM | POA: Diagnosis not present

## 2022-08-07 DIAGNOSIS — R109 Unspecified abdominal pain: Secondary | ICD-10-CM | POA: Diagnosis not present

## 2022-08-07 DIAGNOSIS — N1330 Unspecified hydronephrosis: Secondary | ICD-10-CM | POA: Diagnosis not present

## 2022-09-02 ENCOUNTER — Telehealth: Payer: Self-pay

## 2022-09-02 NOTE — Patient Outreach (Signed)
  Care Coordination TOC Note Transition Care Management Follow-up Telephone Call Date of discharge and from where: 09/01/22-[facility]   Dx:"c-section" How have you been since you were released from the hospital? Patient states she is doing well so far. Denies any acute issues or concerns at this time.  Any questions or concerns? No  Items Reviewed: Did the pt receive and understand the discharge instructions provided? Yes  Medications obtained and verified? No -Patient had to end call early as hematologist office was calling her Other? Yes  Any new allergies since your discharge? No  Dietary orders reviewed? No Do you have support at home? Yes   Home Care and Equipment/Supplies: Were home health services ordered? not applicable If so, what is the name of the agency? N/A  Has the agency set up a time to come to the patient's home? not applicable Were any new equipment or medical supplies ordered?  No What is the name of the medical supply agency? N/A Were you able to get the supplies/equipment? not applicable Do you have any questions related to the use of the equipment or supplies? No  Functional Questionnaire: (I = Independent and D = Dependent) ADLs: I  Bathing/Dressing- I  Meal Prep- I  Eating- I  Maintaining continence- I  Transferring/Ambulation- I  Managing Meds- I  Follow up appointments reviewed:  PCP Hospital f/u appt confirmed? No  . [facility] f/u appt confirmed?  Patient to follow up with OB/GYN  . Are transportation arrangements needed? No  If their condition worsens, is the pt aware to call PCP or go to the Emergency Dept.? Yes Was the patient provided with contact information for the PCP's office or ED? Yes Was to pt encouraged to call back with questions or concerns? Yes  SDOH assessments and interventions completed:   No   Care Coordination Interventions:  Education provided     Encounter Outcome:  Pt. Visit Completed      Enzo Montgomery, RN,BSN,CCM [facility] Telephonic Care Management Coordinator Direct Phone: [phone] Toll Free: [phone] Fax: [phone]

## 2022-10-01 ENCOUNTER — Encounter: Payer: Self-pay | Admitting: Family Medicine

## 2022-10-14 ENCOUNTER — Ambulatory Visit: Payer: Self-pay | Admitting: Surgery

## 2022-10-14 DIAGNOSIS — K8020 Calculus of gallbladder without cholecystitis without obstruction: Secondary | ICD-10-CM

## 2022-10-15 ENCOUNTER — Encounter (HOSPITAL_COMMUNITY): Payer: Self-pay | Admitting: Surgery

## 2022-10-15 ENCOUNTER — Other Ambulatory Visit: Payer: Self-pay

## 2022-10-15 NOTE — Progress Notes (Signed)
Spoke with pt for pre-op call. Pt denies cardiac history, HTN or Diabetes. Pt has hx of anemia and most recently after giving birth in December. Had to have blood transfusion after c-section. Pt is breast feeding and wants to be able to pump while she is in pre-op and then in recovery.   Pt has Hibiclens at home and will use that day of surgery with her shower.

## 2022-10-16 NOTE — Progress Notes (Signed)
Anesthesia Chart Review: Same day workup  Patient recently admitted to [facility] 08/29/2022 for planned LTCS on 08/29/2022. She underwent surgery without complications. Post operatively, she had ane pisode of chest pain that spontaneously resolved and became anemic to hgb 5, so was given 2 units of blood. She recovered appropriately. By post-operative day number 3, the patient was meeting all goals for discharge.    She has history of chronic anemia since childhood and is followed by hematology at [facility].  She has also required transfusions during prior pregnancies. Per prior notes, "Her anemia was found to be hyperproliferative with adequate reticulocytosis and no evidence of hemolysis. Reason for blood loss was not identified."  Last visit 09/17/2022, noted to have prior anaphylactic reaction to Feraheme.  She is also noted to have a cold antibody of unknown significance detected on last [place] test but no evidence of hemolysis.  Follow-up quantitative cold agglutinin titer 09/17/22 was negative.  Direct Coombs test on 09/17/2022 was negative.  Haptoglobin was noted to be low at 21.  Reticulocyte count chronically elevated.  It is also noted that patient "Accepts only non-vaccinated blood from direct donors."  Dr. Brantley Stage is aware of this and states he did have a conversation with the patient regarding this and all associated risks.  Most recent CBC 09/25/2022 showed hemoglobin 9.7, otherwise unremarkable.  CMP 09/25/2022 showed elevated alk phos 291, total bili at 1.99, ALT at 72, and AST at 135.  Examination elevations appear to be somewhat chronic for her.  Pt will need DOS evaluation.  EKG 08/31/2022: NSR. Rate 95. Nonspecific ST changes. Poor R wave progression.    Wynonia Musty [facility]/Anesthesiology Phone [phone] 10/16/2022 10:37 AM

## 2022-10-16 NOTE — Anesthesia Preprocedure Evaluation (Addendum)
Anesthesia Evaluation  Patient identified by MRN, date of birth, ID band Patient awake    Reviewed: Allergy & Precautions, NPO status , Patient's Chart, lab work & pertinent test results, reviewed documented beta blocker date and time   Airway Mallampati: II       Dental no notable dental hx. (+) Teeth Intact, Dental Advisory Given   Pulmonary neg pulmonary ROS   Pulmonary exam normal breath sounds clear to auscultation       Cardiovascular negative cardio ROS Normal cardiovascular exam Rhythm:Regular Rate:Normal     Neuro/Psych  PSYCHIATRIC DISORDERS   Bipolar Disorder   negative neurological ROS     GI/Hepatic Elevated LFT's Symptomatic cholelithiasis   Endo/Other    Morbid obesity  Renal/GU negative Renal ROS  negative genitourinary   Musculoskeletal negative musculoskeletal ROS (+)    Abdominal  (+) + obese  Peds  Hematology  (+) Blood dyscrasia, anemia , REFUSES BLOOD PRODUCTSChronic anemia   Anesthesia Other Findings   Reproductive/Obstetrics                              Anesthesia Physical Anesthesia Plan  ASA: 3  Anesthesia Plan: General   Post-op Pain Management: Tylenol PO (pre-op)*, Precedex and Dilaudid IV   Induction: Intravenous, Rapid sequence and Cricoid pressure planned  PONV Risk Score and Plan: 4 or greater and Treatment may vary due to age or medical condition, Midazolam, Dexamethasone and Ondansetron  Airway Management Planned: Oral ETT  Additional Equipment: None  Intra-op Plan:   Post-operative Plan: Extubation in OR  Informed Consent: I have reviewed the patients History and Physical, chart, labs and discussed the procedure including the risks, benefits and alternatives for the proposed anesthesia with the patient or authorized representative who has indicated his/her understanding and acceptance.     Dental advisory given  Plan Discussed with:  CRNA and Anesthesiologist  Anesthesia Plan Comments: (PAT note by Karoline Caldwell, PA-C: Patient recently admitted to [facility] 08/29/2022 for planned LTCS on 08/29/2022. She underwent surgery without complications. Post operatively, she had ane pisode of chest pain that spontaneously resolved and became anemic to hgb 5, so was given 2 units of blood. She recovered appropriately. By post-operative day number 3, the patient was meeting all goals for discharge.    She has history of chronic anemia since childhood and is followed by hematology at [facility].  She has also required transfusions during prior pregnancies. Per prior notes, "Her anemia was found to be hyperproliferative with adequate reticulocytosis and no evidence of hemolysis. Reason for blood loss was not identified."  Last visit 09/17/2022, noted to have prior anaphylactic reaction to Feraheme.  She is also noted to have a cold antibody of unknown significance detected on last Coombs test but no evidence of hemolysis.  Follow-up quantitative cold agglutinin titer 09/17/22 was negative.  Direct Coombs test on 09/17/2022 was negative.  Haptoglobin was noted to be low at 21.  Reticulocyte count chronically elevated.  It is also noted that patient "Accepts only non-vaccinated blood from direct donors."  Dr. Brantley Stage is aware of this and states he did have a conversation with the patient regarding this and all associated risks.  Most recent CBC 09/25/2022 showed hemoglobin 9.7, otherwise unremarkable.  CMP 09/25/2022 showed elevated alk phos 291, total bili at 1.99, ALT at 72, and AST at 135.  Examination elevations appear to be somewhat chronic for her.  Pt will need DOS evaluation.  EKG 08/31/2022: NSR. Rate 95. Nonspecific ST changes. Poor R wave progression.  )         Anesthesia Quick Evaluation

## 2022-10-17 ENCOUNTER — Encounter (HOSPITAL_COMMUNITY): Payer: Self-pay | Admitting: Surgery

## 2022-10-17 ENCOUNTER — Other Ambulatory Visit: Payer: Self-pay

## 2022-10-17 ENCOUNTER — Ambulatory Visit (HOSPITAL_COMMUNITY): Payer: 59 | Admitting: Physician Assistant

## 2022-10-17 ENCOUNTER — Ambulatory Visit (HOSPITAL_BASED_OUTPATIENT_CLINIC_OR_DEPARTMENT_OTHER): Payer: 59 | Admitting: Physician Assistant

## 2022-10-17 ENCOUNTER — Ambulatory Visit (HOSPITAL_COMMUNITY)
Admission: RE | Admit: 2022-10-17 | Discharge: 2022-10-17 | Disposition: A | Discharge status: Home / Self Care | Payer: 59 | Attending: Surgery | Admitting: Surgery

## 2022-10-17 ENCOUNTER — Encounter (HOSPITAL_COMMUNITY)
Admission: RE | Disposition: A | Discharge status: Home / Self Care | Payer: Self-pay | Source: Home / Self Care | Attending: Surgery

## 2022-10-17 DIAGNOSIS — F319 Bipolar disorder, unspecified: Secondary | ICD-10-CM

## 2022-10-17 DIAGNOSIS — Z6836 Body mass index (BMI) 36.0-36.9, adult: Secondary | ICD-10-CM

## 2022-10-17 DIAGNOSIS — K8000 Calculus of gallbladder with acute cholecystitis without obstruction: Secondary | ICD-10-CM

## 2022-10-17 DIAGNOSIS — D649 Anemia, unspecified: Secondary | ICD-10-CM | POA: Diagnosis not present

## 2022-10-17 DIAGNOSIS — D759 Disease of blood and blood-forming organs, unspecified: Secondary | ICD-10-CM | POA: Diagnosis not present

## 2022-10-17 DIAGNOSIS — K8020 Calculus of gallbladder without cholecystitis without obstruction: Secondary | ICD-10-CM

## 2022-10-17 DIAGNOSIS — K8010 Calculus of gallbladder with chronic cholecystitis without obstruction: Secondary | ICD-10-CM | POA: Diagnosis present

## 2022-10-17 HISTORY — PX: CHOLECYSTECTOMY: SHX55

## 2022-10-17 LAB — COMPREHENSIVE METABOLIC PANEL
ALT: 17 U/L (ref 0–44)
AST: 21 U/L (ref 15–41)
Albumin: 3.9 g/dL (ref 3.5–5.0)
Alkaline Phosphatase: 72 U/L (ref 38–126)
Anion gap: 8 (ref 5–15)
BUN: 11 mg/dL (ref 6–20)
CO2: 24 mmol/L (ref 22–32)
Calcium: 8.6 mg/dL — ABNORMAL LOW (ref 8.9–10.3)
Chloride: 102 mmol/L (ref 98–111)
Creatinine, Ser: 0.6 mg/dL (ref 0.44–1.00)
GFR, Estimated: >60 mL/min (ref >60)
Glucose, Bld: 95 mg/dL (ref 70–99)
Potassium: 3.9 mmol/L (ref 3.5–5.1)
Sodium: 134 mmol/L — ABNORMAL LOW (ref 135–145)
Total Bilirubin: 0.8 mg/dL (ref 0.3–1.2)
Total Protein: 6.9 g/dL (ref 6.5–8.1)

## 2022-10-17 LAB — CBC WITH DIFFERENTIAL/PLATELET
Abs Immature Granulocytes: 0.01 10*3/uL (ref 0.00–0.07)
Basophils Absolute: 0 10*3/uL (ref 0.0–0.1)
Basophils Relative: 0 %
Eosinophils Absolute: 0.1 10*3/uL (ref 0.0–0.5)
Eosinophils Relative: 3 %
HCT: 29.7 % — ABNORMAL LOW (ref 36.0–46.0)
Hemoglobin: 9.6 g/dL — ABNORMAL LOW (ref 12.0–15.0)
Immature Granulocytes: 0 %
Lymphocytes Relative: 54 %
Lymphs Abs: 2.6 10*3/uL (ref 0.7–4.0)
MCH: 33.3 pg (ref 26.0–34.0)
MCHC: 32.3 g/dL (ref 30.0–36.0)
MCV: 103.1 fL — ABNORMAL HIGH (ref 80.0–100.0)
Monocytes Absolute: 0.2 10*3/uL (ref 0.1–1.0)
Monocytes Relative: 4 %
Neutro Abs: 1.9 10*3/uL (ref 1.7–7.7)
Neutrophils Relative %: 39 %
Platelets: 296 10*3/uL (ref 150–400)
RBC: 2.88 MIL/uL — ABNORMAL LOW (ref 3.87–5.11)
RDW: 11.9 % (ref 11.5–15.5)
WBC: 4.8 10*3/uL (ref 4.0–10.5)
nRBC: 0 % (ref 0.0–0.2)

## 2022-10-17 LAB — POCT PREGNANCY, URINE: Preg Test, Ur: NEGATIVE

## 2022-10-17 SURGERY — LAPAROSCOPIC CHOLECYSTECTOMY
Anesthesia: General | Site: Abdomen

## 2022-10-17 MED ORDER — ONDANSETRON HCL 4 MG PO TABS: 4.0000 mg | ORAL_TABLET | Freq: Three times a day (TID) | ORAL | 0 refills | Status: DC | PRN

## 2022-10-17 MED ORDER — DEXMEDETOMIDINE HCL IN NACL 80 MCG/20ML IV SOLN
INTRAVENOUS | Status: AC
  Filled 2022-10-17: qty 20

## 2022-10-17 MED ORDER — HYDROMORPHONE HCL 1 MG/ML IJ SOLN
0.2500 mg | INTRAMUSCULAR | Status: DC | PRN
  Administered 2022-10-17 (×4): 0.5 mg via INTRAVENOUS

## 2022-10-17 MED ORDER — ORAL CARE MOUTH RINSE: 15.0000 mL | Freq: Once | OROMUCOSAL | Status: AC

## 2022-10-17 MED ORDER — ACETAMINOPHEN 500 MG PO TABS
1000.0000 mg | ORAL_TABLET | ORAL | Status: AC
  Administered 2022-10-17: 1000 mg via ORAL
  Filled 2022-10-17: qty 2

## 2022-10-17 MED ORDER — HYDROMORPHONE HCL 1 MG/ML IJ SOLN
INTRAMUSCULAR | Status: AC
  Filled 2022-10-17: qty 1

## 2022-10-17 MED ORDER — OXYCODONE HCL 5 MG PO TABS: 5.0000 mg | ORAL_TABLET | Freq: Once | ORAL | Status: DC | PRN

## 2022-10-17 MED ORDER — BUPIVACAINE-EPINEPHRINE (PF) 0.5% -1:200000 IJ SOLN
INTRAMUSCULAR | Status: AC
  Filled 2022-10-17: qty 30

## 2022-10-17 MED ORDER — MIDAZOLAM HCL 2 MG/2ML IJ SOLN
INTRAMUSCULAR | Status: AC
  Filled 2022-10-17: qty 2

## 2022-10-17 MED ORDER — PROPOFOL 10 MG/ML IV BOLUS
INTRAVENOUS | Status: AC
  Filled 2022-10-17: qty 20

## 2022-10-17 MED ORDER — SODIUM CHLORIDE 0.9 % IR SOLN
Status: DC | PRN
  Administered 2022-10-17: 1000 mL

## 2022-10-17 MED ORDER — FENTANYL CITRATE (PF) 250 MCG/5ML IJ SOLN
INTRAMUSCULAR | Status: AC
  Filled 2022-10-17: qty 5

## 2022-10-17 MED ORDER — CEFAZOLIN SODIUM-DEXTROSE 2-4 GM/100ML-% IV SOLN
2.0000 g | INTRAVENOUS | Status: AC
  Administered 2022-10-17: 2 g via INTRAVENOUS
  Filled 2022-10-17: qty 100

## 2022-10-17 MED ORDER — LIDOCAINE 2% (20 MG/ML) 5 ML SYRINGE
INTRAMUSCULAR | Status: AC
  Filled 2022-10-17: qty 5

## 2022-10-17 MED ORDER — BUPIVACAINE HCL (PF) 0.25 % IJ SOLN
INTRAMUSCULAR | Status: DC | PRN
  Administered 2022-10-17: 11 mL

## 2022-10-17 MED ORDER — SUGAMMADEX SODIUM 200 MG/2ML IV SOLN
INTRAVENOUS | Status: DC | PRN
  Administered 2022-10-17: 100 mg via INTRAVENOUS
  Administered 2022-10-17: 200 mg via INTRAVENOUS

## 2022-10-17 MED ORDER — LACTATED RINGERS IV SOLN: INTRAVENOUS | Status: DC

## 2022-10-17 MED ORDER — DEXMEDETOMIDINE HCL IN NACL 80 MCG/20ML IV SOLN
INTRAVENOUS | Status: DC | PRN
  Administered 2022-10-17 (×3): 4 ug via BUCCAL

## 2022-10-17 MED ORDER — 0.9 % SODIUM CHLORIDE (POUR BTL) OPTIME
TOPICAL | Status: DC | PRN
  Administered 2022-10-17: 1000 mL

## 2022-10-17 MED ORDER — DEXAMETHASONE SODIUM PHOSPHATE 10 MG/ML IJ SOLN
INTRAMUSCULAR | Status: AC
  Filled 2022-10-17: qty 1

## 2022-10-17 MED ORDER — CHLORHEXIDINE GLUCONATE CLOTH 2 % EX PADS: 6.0000 | MEDICATED_PAD | Freq: Once | CUTANEOUS | Status: DC

## 2022-10-17 MED ORDER — HEMOSTATIC AGENTS (NO CHARGE) OPTIME
TOPICAL | Status: DC | PRN
  Administered 2022-10-17: 1 via TOPICAL

## 2022-10-17 MED ORDER — ONDANSETRON HCL 4 MG/2ML IJ SOLN
INTRAMUSCULAR | Status: DC | PRN
  Administered 2022-10-17: 4 mg via INTRAVENOUS

## 2022-10-17 MED ORDER — PHENYLEPHRINE 80 MCG/ML (10ML) SYRINGE FOR IV PUSH (FOR BLOOD PRESSURE SUPPORT)
PREFILLED_SYRINGE | INTRAVENOUS | Status: DC | PRN
  Administered 2022-10-17: 120 ug via INTRAVENOUS

## 2022-10-17 MED ORDER — ROCURONIUM BROMIDE 10 MG/ML (PF) SYRINGE
PREFILLED_SYRINGE | INTRAVENOUS | Status: DC | PRN
  Administered 2022-10-17: 90 mg via INTRAVENOUS

## 2022-10-17 MED ORDER — OXYCODONE HCL 5 MG/5ML PO SOLN: 5.0000 mg | Freq: Once | ORAL | Status: DC | PRN

## 2022-10-17 MED ORDER — ONDANSETRON HCL 4 MG/2ML IJ SOLN
INTRAMUSCULAR | Status: AC
  Filled 2022-10-17: qty 2

## 2022-10-17 MED ORDER — BUPIVACAINE HCL (PF) 0.25 % IJ SOLN
INTRAMUSCULAR | Status: AC
  Filled 2022-10-17: qty 30

## 2022-10-17 MED ORDER — DEXAMETHASONE SODIUM PHOSPHATE 10 MG/ML IJ SOLN
INTRAMUSCULAR | Status: DC | PRN
  Administered 2022-10-17: 10 mg via INTRAVENOUS

## 2022-10-17 MED ORDER — PROPOFOL 10 MG/ML IV BOLUS
INTRAVENOUS | Status: DC | PRN
  Administered 2022-10-17: 200 mg via INTRAVENOUS

## 2022-10-17 MED ORDER — ONDANSETRON HCL 4 MG/2ML IJ SOLN: 4.0000 mg | Freq: Once | INTRAMUSCULAR | Status: DC | PRN

## 2022-10-17 MED ORDER — LIDOCAINE 2% (20 MG/ML) 5 ML SYRINGE
INTRAMUSCULAR | Status: DC | PRN
  Administered 2022-10-17: 100 mg via INTRAVENOUS

## 2022-10-17 MED ORDER — ROCURONIUM BROMIDE 10 MG/ML (PF) SYRINGE
PREFILLED_SYRINGE | INTRAVENOUS | Status: AC
  Filled 2022-10-17: qty 10

## 2022-10-17 MED ORDER — OXYCODONE HCL 5 MG PO TABS: 5.0000 mg | ORAL_TABLET | Freq: Four times a day (QID) | ORAL | 0 refills | Status: DC | PRN

## 2022-10-17 MED ORDER — FENTANYL CITRATE (PF) 250 MCG/5ML IJ SOLN
INTRAMUSCULAR | Status: DC | PRN
  Administered 2022-10-17: 50 ug via INTRAVENOUS
  Administered 2022-10-17: 200 ug via INTRAVENOUS

## 2022-10-17 MED ORDER — CHLORHEXIDINE GLUCONATE 0.12 % MT SOLN
15.0000 mL | Freq: Once | OROMUCOSAL | Status: AC
  Administered 2022-10-17: 15 mL via OROMUCOSAL
  Filled 2022-10-17: qty 15

## 2022-10-17 SURGICAL SUPPLY — 38 items
APPLICATOR ARISTA FLEXITIP XL (MISCELLANEOUS) IMPLANT
APPLIER CLIP ROT 10 11.4 M/L (STAPLE) ×1
BAG COUNTER SPONGE SURGICOUNT (BAG) ×1 IMPLANT
BLADE CLIPPER SURG (BLADE) IMPLANT
CANISTER SUCT 3000ML PPV (MISCELLANEOUS) ×1 IMPLANT
CHLORAPREP W/TINT 26 (MISCELLANEOUS) ×1 IMPLANT
CLIP APPLIE ROT 10 11.4 M/L (STAPLE) ×1 IMPLANT
COVER SURGICAL LIGHT HANDLE (MISCELLANEOUS) ×1 IMPLANT
DERMABOND ADVANCED .7 DNX12 (GAUZE/BANDAGES/DRESSINGS) ×1 IMPLANT
ELECT REM PT RETURN 9FT ADLT (ELECTROSURGICAL) ×1
ELECTRODE REM PT RTRN 9FT ADLT (ELECTROSURGICAL) ×1 IMPLANT
GLOVE BIO SURGEON STRL SZ8 (GLOVE) ×1 IMPLANT
GLOVE BIOGEL PI IND STRL 8 (GLOVE) ×1 IMPLANT
GOWN STRL REUS W/ TWL LRG LVL3 (GOWN DISPOSABLE) ×2 IMPLANT
GOWN STRL REUS W/ TWL XL LVL3 (GOWN DISPOSABLE) ×1 IMPLANT
GOWN STRL REUS W/TWL LRG LVL3 (GOWN DISPOSABLE) ×2
GOWN STRL REUS W/TWL XL LVL3 (GOWN DISPOSABLE) ×1
HEMOSTAT ARISTA ABSORB 3G PWDR (HEMOSTASIS) IMPLANT
KIT BASIN OR (CUSTOM PROCEDURE TRAY) ×1 IMPLANT
KIT TURNOVER KIT B (KITS) ×1 IMPLANT
NS IRRIG 1000ML POUR BTL (IV SOLUTION) ×1 IMPLANT
PAD ARMBOARD 7.5X6 YLW CONV (MISCELLANEOUS) ×1 IMPLANT
POUCH RETRIEVAL ECOSAC 10 (ENDOMECHANICALS) ×1 IMPLANT
POUCH RETRIEVAL ECOSAC 10MM (ENDOMECHANICALS) ×1
SCISSORS LAP 5X35 DISP (ENDOMECHANICALS) ×1 IMPLANT
SET IRRIG TUBING LAPAROSCOPIC (IRRIGATION / IRRIGATOR) ×1 IMPLANT
SET TUBE SMOKE EVAC HIGH FLOW (TUBING) ×1 IMPLANT
SLEEVE Z-THREAD 5X100MM (TROCAR) ×1 IMPLANT
SPECIMEN JAR SMALL (MISCELLANEOUS) ×1 IMPLANT
SUT MNCRL AB 4-0 PS2 18 (SUTURE) ×1 IMPLANT
TOWEL GREEN STERILE (TOWEL DISPOSABLE) ×1 IMPLANT
TOWEL GREEN STERILE FF (TOWEL DISPOSABLE) ×1 IMPLANT
TRAY LAPAROSCOPIC MC (CUSTOM PROCEDURE TRAY) ×1 IMPLANT
TROCAR 11X100 Z THREAD (TROCAR) ×1 IMPLANT
TROCAR BALLN 12MMX100 BLUNT (TROCAR) ×1 IMPLANT
TROCAR Z-THREAD OPTICAL 5X100M (TROCAR) ×1 IMPLANT
WARMER LAPAROSCOPE (MISCELLANEOUS) ×1 IMPLANT
WATER STERILE IRR 1000ML POUR (IV SOLUTION) ×1 IMPLANT

## 2022-10-17 NOTE — Progress Notes (Signed)
Dr. Brantley Stage aware of pt's Hgb of 9.8 today. No orders at this time.

## 2022-10-17 NOTE — Anesthesia Postprocedure Evaluation (Signed)
Anesthesia Post Note  Patient: Andrea [name]  Procedure(s) Performed: LAPAROSCOPIC CHOLECYSTECTOMY (Abdomen)     Patient location during evaluation: PACU Anesthesia Type: General Level of consciousness: awake and alert and oriented Pain management: pain level controlled Vital Signs Assessment: post-procedure vital signs reviewed and stable Respiratory status: spontaneous breathing, nonlabored ventilation and respiratory function stable Cardiovascular status: blood pressure returned to baseline and stable Postop Assessment: no apparent nausea or vomiting Anesthetic complications: no   No notable events documented.  Last Vitals:  Vitals:   10/17/22 0945 10/17/22 1000  BP: 108/71 (!) 97/58  Pulse: 74 61  Resp: 18 19  Temp:    SpO2: 95% 96%    Last Pain:  Vitals:   10/17/22 1000  TempSrc:   PainSc: 9                  Taisa Deloria A.

## 2022-10-17 NOTE — Discharge Instructions (Signed)
[facility] ______[facility] LAPAROSCOPIC SURGERY: POST OP INSTRUCTIONS Always review your discharge instruction sheet given to you by the facility where your surgery was performed. IF YOU HAVE DISABILITY OR FAMILY LEAVE FORMS, YOU MUST BRING THEM TO THE OFFICE FOR PROCESSING.   DO NOT GIVE THEM TO YOUR DOCTOR.  A prescription for pain medication may be given to you upon discharge.  Take your pain medication as prescribed, if needed.  If narcotic pain medicine is not needed, then you may take acetaminophen (Tylenol) or ibuprofen (Advil) as needed. Take your usually prescribed medications unless otherwise directed. If you need a refill on your pain medication, please contact your pharmacy.  They will contact our office to request authorization. Prescriptions will not be filled after 5pm or on week-ends. You should follow a light diet the first few days after arrival home, such as soup and crackers, etc.  Be sure to include lots of fluids daily. Most patients will experience some swelling and bruising in the area of the incisions.  Ice packs will help.  Swelling and bruising can take several days to resolve.  It is common to experience some constipation if taking pain medication after surgery.  Increasing fluid intake and taking a stool softener (such as Colace) will usually help or prevent this problem from occurring.  A mild laxative (Milk of Magnesia or Miralax) should be taken according to package instructions if there are no bowel movements after 48 hours. Unless discharge instructions indicate otherwise, you may remove your bandages 24-48 hours after surgery, and you may shower at that time.  You may have steri-strips (small skin tapes) in place directly over the incision.  These strips should be left on the skin for 7-10 days.  If your surgeon used skin glue on the incision, you may shower in 24 hours.  The glue will flake off over the next 2-3 weeks.  Any sutures or staples will be  removed at the office during your follow-up visit. ACTIVITIES:  You may resume regular (light) daily activities beginning the next day--such as daily self-care, walking, climbing stairs--gradually increasing activities as tolerated.  You may have sexual intercourse when it is comfortable.  Refrain from any heavy lifting or straining until approved by your doctor. You may drive when you are no longer taking prescription pain medication, you can comfortably wear a seatbelt, and you can safely maneuver your car and apply brakes. RETURN TO WORK:  __________________________________________________________ You should see your doctor in the office for a follow-up appointment approximately 2-3 weeks after your surgery.  Make sure that you call for this appointment within a day or two after you arrive home to insure a convenient appointment time. OTHER INSTRUCTIONS: __________________________________________________________________________________________________________________________ __________________________________________________________________________________________________________________________ WHEN TO CALL YOUR DOCTOR: Fever over 101.0 Inability to urinate Continued bleeding from incision. Increased pain, redness, or drainage from the incision. Increasing abdominal pain  The clinic staff is available to answer your questions during regular business hours.  Please don't hesitate to call and ask to speak to one of the nurses for clinical concerns.  If you have a medical emergency, go to the nearest emergency room or call 911.  A surgeon from [facility] is always on call at the hospital. [address], [address], [place], [place]  [address] ? [address] [address], [place],    [address] [phone] ? [phone] ? FAX [phone] Web site: [email or web address]  

## 2022-10-17 NOTE — Transfer of Care (Signed)
Immediate Anesthesia Transfer of Care Note  Patient: Andrea [name]  Procedure(s) Performed: LAPAROSCOPIC CHOLECYSTECTOMY (Abdomen)  Patient Location: PACU  Anesthesia Type:General  Level of Consciousness: drowsy  Airway & Oxygen Therapy: Patient Spontanous Breathing  Post-op Assessment: Report given to RN and Post -op Vital signs reviewed and stable  Post vital signs: Reviewed and stable  Last Vitals:  Vitals Value Taken Time  BP 96/44 10/17/22 0856  Temp    Pulse 74 10/17/22 0855  Resp 15 10/17/22 0857  SpO2 95 % 10/17/22 0855  Vitals shown include unvalidated device data.  Last Pain:  Vitals:   10/17/22 0633  TempSrc:   PainSc: 3          Complications: No notable events documented.

## 2022-10-17 NOTE — Op Note (Signed)
Laparoscopic Cholecystectomy  Procedure Note  Indications: This patient presents with symptomatic gallbladder disease and will undergo laparoscopic cholecystectomy.  History of biliary colic especially during her pregnancy recently had a child.  The pain is quite debilitating with nausea, vomiting and right upper quadrant pain limiting her p.o. intake.  Evaluation showed gallstones.  Her symptoms are worsening she presents today for cholecystectomy.  Medical history reviewed. Discussed with cardiology as well as medicine blood product use which she does not want.The procedure has been discussed with the patient. Operative and non operative treatments have been discussed. Risks of surgery include bleeding, infection,  Common bile duct injury,  Injury to the stomach,liver, colon,small intestine, abdominal wall,  Diaphragm,  Major blood vessels,  And the need for an open procedure.  Other risks include worsening of medical problems, death,  DVT and pulmonary embolism, and cardiovascular events.   Medical options have also been discussed. The patient has been informed of long term expectations of surgery and non surgical options,  The patient agrees to proceed.     Pre-operative Diagnosis: Calculus of gallbladder with acute cholecystitis, without mention of obstruction  Post-operative Diagnosis: Same  Surgeon: Turner Daniels  MD   Assistants: OR staff  Anesthesia: General endotracheal anesthesia and Local anesthesia 0.25.% bupivacaine  ASA Class: 1  Procedure Details  The patient was seen again in the Holding Room. The risks, benefits, complications, treatment options, and expected outcomes were discussed with the patient. The possibilities of reaction to medication, pulmonary aspiration, perforation of viscus, bleeding, recurrent infection, finding a normal gallbladder, the need for additional procedures, failure to diagnose a condition, the possible need to convert to an open procedure, and  creating a complication requiring transfusion or operation were discussed with the patient. The patient and/or family concurred with the proposed plan, giving informed consent. The site of surgery properly noted/marked. The patient was taken to Operating Room, identified as Andrea [name] and the procedure verified as Laparoscopic Cholecystectomy with Intraoperative Cholangiograms. A Time Out was held and the above information confirmed.  Prior to the induction of general anesthesia, antibiotic prophylaxis was administered. General endotracheal anesthesia was then administered and tolerated well. After the induction, the abdomen was prepped in the usual sterile fashion. The patient was positioned in the supine position with the left arm comfortably tucked, along with some reverse Trendelenburg.  Local anesthetic agent was injected into the skin near the umbilicus and an incision made. The midline fascia was incised and the Hasson technique was used to introduce a 12 mm port under direct vision. It was secured with a figure of eight Vicryl suture placed in the usual fashion. Pneumoperitoneum was then created with CO2 and tolerated well without any adverse changes in the patient's vital signs. Additional trocars were introduced under direct vision with an 11 mm trocar in the epigastrium and 2 5 mm trocars in the right upper quadrant. All skin incisions were infiltrated with a local anesthetic agent before making the incision and placing the trocars.   The gallbladder was identified, the fundus grasped and retracted cephalad. Adhesions were lysed bluntly and with the electrocautery where indicated, taking care not to injure any adjacent organs or viscus. The infundibulum was grasped and retracted laterally, exposing the peritoneum overlying the triangle of Calot. This was then divided and exposed in a blunt fashion. The cystic duct was clearly identified and bluntly dissected circumferentially. The junctions of  the gallbladder, cystic duct and common bile duct were clearly identified prior to the  division of any linear structure.      The cystic duct was very small. The junction of the CBD and cystic duct was visualized. Given small size, I did not feel the catheter would fit. No cholangiogram was performed.    The cystic duct was then  ligated with surgical clips  on the patient side and  clipped on the gallbladder side and divided. The cystic artery was identified, dissected free, ligated with clips and divided as well. Posterior cystic artery clipped and divided.  The gallbladder was dissected from the liver bed in retrograde fashion with the electrocautery. The gallbladder was removed. The liver bed was irrigated and inspected. Hemostasis was achieved with the electrocautery and Arista. Copious irrigation was utilized and was repeatedly aspirated until clear all particulate matter. Hemostasis was achieved with no signs  Of bleeding or bile leakage.  Pneumoperitoneum was completely reduced after viewing removal of the trocars under direct vision. The wound was thoroughly irrigated and the fascia was then closed with a figure of eight suture; the skin was then closed with 4 0 monocryl  and a sterile dressing with Dermabond  Good was applied.  Instrument, sponge, and needle counts were correct at closure and at the conclusion of the case.   Findings:  Cholelithiasis  Estimated Blood Loss: Minimal         Drains: none          Total IV Fluids per record          Specimens: Gallbladder           Complications: None; patient tolerated the procedure well.         Disposition: PACU - hemodynamically stable.         Condition: stable

## 2022-10-17 NOTE — Anesthesia Procedure Notes (Signed)
Procedure Name: Intubation Date/Time: 10/17/2022 7:42 AM  Performed by: Josephine Igo, MDPre-anesthesia Checklist: Patient identified, Emergency Drugs available, Suction available and Patient being monitored Patient Re-evaluated:Patient Re-evaluated prior to induction Oxygen Delivery Method: Circle system utilized Preoxygenation: Pre-oxygenation with 100% oxygen Induction Type: IV induction Ventilation: Mask ventilation without difficulty Laryngoscope Size: Mac and 3 Grade View: Grade I Tube type: Oral Tube size: 7.0 mm Number of attempts: 2 Airway Equipment and Method: Stylet and Oral airway Placement Confirmation: ETT inserted through vocal cords under direct vision, positive ETCO2 and breath sounds checked- equal and bilateral Secured at: 22 cm Tube secured with: Tape Dental Injury: Injury to lip  Comments: Easy mask.  DL by paramedic student under supervision of anesthesiologist, ETT placed into esophagus. DL by MD with grade 1 view, ETT placed through cords. +BBS, +chest rise and fall +EtCO2.  Patient with lip injury, lubricant ointment applied.

## 2022-10-17 NOTE — H&P (Signed)
History of Present Illness: Andrea [name] is a 42 y.o. female who is seen today as an office consultation for evaluation of Cholelithiasis  Patient presents for evaluation of abdominal pain. She has had a multiday history of epigastric and right upper quadrant abdominal pain. She was seen last week encouragement was found to have gallstones of ultrasound but not any hard signs of cholecystitis. She still has bouts of pain in her right upper quadrant. She is 5 weeks status postdelivery of a child. She is breast-feeding. Pain is made worse with certain foods. She does have radiation to her right shoulder. Ultrasound revealed gallstones and normal-sized common bile duct from [facility]. Liver function studies and white count were normal.  Review of Systems: A complete review of systems was obtained from the patient. I have reviewed this information and discussed as appropriate with the patient. See HPI as well for other ROS.    Medical History: Past Medical History:  Diagnosis Date  Anemia   There is no problem list on file for this patient.  Past Surgical History:  Procedure Laterality Date  APPENDECTOMY  CESAREAN SECTION    Allergies  Allergen Reactions  Ferumoxytol Anaphylaxis  Chest pain during infusion  Lamotrigine Other (See Comments), Rash and Unknown  SJS   SJS   Current Outpatient Medications on File Prior to Visit  Medication Sig Dispense Refill  ibuprofen (MOTRIN) 800 MG tablet  prenatal vit-CA-MIN-FE-FA ([facility] ORAL) tablet Take 1 tablet by mouth once daily   No current facility-administered medications on file prior to visit.   History reviewed. No pertinent family history.   Social History   Tobacco Use  Smoking Status Never  Smokeless Tobacco Never    Social History   Socioeconomic History  Marital status: Married  Tobacco Use  Smoking status: Never  Smokeless tobacco: Never   Objective:   Vitals:  10/14/22 1407  BP: 110/72  Pulse: 98  Weight: (!)  103.1 kg (227 lb 6.4 oz)  Height: 162.6 cm ('5\' 4"'$ )  PainSc: 2   Body mass index is 39.03 kg/m.  Physical Exam HENT:  Head: Normocephalic.  Cardiovascular:  Rate and Rhythm: Normal rate.  Pulmonary:  Effort: Pulmonary effort is normal.  Breath sounds: No stridor.  Abdominal:  General: Abdomen is flat.  Palpations: Abdomen is soft.  Tenderness: There is abdominal tenderness in the right upper quadrant and epigastric area. Negative signs include Murphy's sign.  Musculoskeletal:  General: Normal range of motion.  Skin: General: Skin is warm.  Neurological:  General: No focal deficit present.  Mental Status: She is alert.     Labs, Imaging and Diagnostic Testing: FINDINGS: # Aorta: No abdominal aortic aneurysm. # IVC: Visualized IVC is patent. # Portal vein: Portal vein is patent with normal flow direction. # Pancreas: Obscured by overlying bowel gas. # Liver: No focal liver abnormalities are identified. # Gallbladder: Gallstones without gallbladder wall thickening or pericholecystic fluid. # Common bile duct: CBD size is within normal limits. # Right kidney: No renal stones or hydronephrosis.  Impression: IMPRESSION: Cholelithiasis without sonographic evidence of cholecystitis.  Electronically Signed by: Leslie Dales, MD on 09/25/2022 3:45 AM  Na 138  Potassium 4.5  Cl 105  CO2 22  AGAP 11  Glucose 117 (*)  BUN 17  Creatinine 0.66  Ca 9.4  ALK PHOS 291 (*)  T Bili 1.99 (*)  Total Protein 7.3  Alb 4.3  GLOBULIN 3.0  ALBUMIN/GLOBULIN RATIO 1.4  BUN/CREAT RATIO 25.8  ALT 72 (*)  AST  135 (*)  Assessment and Plan:   Diagnoses and all orders for this visit:  Calculus of gallbladder with chronic cholecystitis without obstruction   Recommend laparoscopic cholecystectomy possible cholangiogram. Risk of bleeding, infection, bile duct injury, bile leakage, injury to blood vessels, nerves, organs adjacent to the gallbladder, anesthesia risk, exacerbation of  underlying medical problems the need for other treatments and/or procedures.  Medical management discussed as well but given the fact she has had ongoing pain now for a number of weeks, I do not feel is appropriate. I reviewed her laboratory studies and ultrasound from [facility] ED.   Kennieth Francois, MD

## 2022-10-17 NOTE — Interval H&P Note (Signed)
History and Physical Interval Note:  10/17/2022 7:15 AM  Andrea [name]  has presented today for surgery, with the diagnosis of GALLSTONES.  The various methods of treatment have been discussed with the patient and family. After consideration of risks, benefits and other options for treatment, the patient has consented to  Procedure(s): LAPAROSCOPIC CHOLECYSTECTOMY (N/A) as a surgical intervention.  The patient's history has been reviewed, patient examined, no change in status, stable for surgery.  I have reviewed the patient's chart and labs.  Questions were answered to the patient's satisfaction.     [place]

## 2022-10-18 ENCOUNTER — Encounter (HOSPITAL_COMMUNITY): Payer: Self-pay | Admitting: Surgery

## 2022-10-18 LAB — SURGICAL PATHOLOGY

## 2022-11-14 ENCOUNTER — Ambulatory Visit: Payer: 59 | Admitting: Family Medicine

## 2023-01-29 ENCOUNTER — Encounter: Payer: Self-pay | Admitting: Family Medicine

## 2023-01-29 ENCOUNTER — Ambulatory Visit (INDEPENDENT_AMBULATORY_CARE_PROVIDER_SITE_OTHER): Payer: 59 | Admitting: Family Medicine

## 2023-01-29 VITALS — BP 102/76 | HR 81 | Temp 97.9°F | Resp 16 | Ht 64.0 in | Wt 216.4 lb

## 2023-01-29 DIAGNOSIS — T782XXA Anaphylactic shock, unspecified, initial encounter: Secondary | ICD-10-CM

## 2023-01-29 MED ORDER — PREDNISONE 10 MG PO TABS: ORAL_TABLET | ORAL | 0 refills | Status: DC

## 2023-01-29 MED ORDER — LIDOCAINE VISCOUS HCL 2 % MT SOLN: 5.0000 mL | Freq: Three times a day (TID) | OROMUCOSAL | 0 refills | Status: DC | PRN

## 2023-01-29 MED ORDER — EPINEPHRINE 0.3 MG/0.3ML IJ SOSY: 1.0000 | PREFILLED_SYRINGE | Freq: Once | INTRAMUSCULAR | 3 refills | Status: AC

## 2023-01-29 NOTE — Progress Notes (Signed)
   Subjective:    Patient ID: Andrea [name], female    DOB: 02-28-1981, 42 y.o.   MRN: [id number]  HPI Mouth swelling- swelling started Sunday afternoon w/ facial swelling and aphthous ulcers of lower lip.  Took antihistamines w/o relief.  Monday developed sores all over mouth.  Tuesday was unable to speak, eat or drink and tongue was 'so swollen it didn't fit in my mouth'- pt went to ER last night.  She was treated w/ epi, steroids, benadryl, and pepcid.  She was given an Epi pen prescription.  ER wanted to admit her but pt declined.  Pt was using a black cumin seed oil mouthwash that coincided w/ sxs.    Still having a hard time talking due to the pain and sores in mouth.  Son had HFM that had cleared by [place].   Review of Systems For ROS see HPI     Objective:   Physical Exam Vitals reviewed.  Constitutional:      General: She is not in acute distress.    Appearance: Normal appearance. She is obese. She is not ill-appearing.  HENT:     Head: Normocephalic and atraumatic.     Mouth/Throat:     Mouth: Mucous membranes are moist.     Comments: Multiple aphthous ulcers on palate, buccal mucosa, lips, and under tongue Musculoskeletal:     Cervical back: Normal range of motion. No rigidity.  Lymphadenopathy:     Cervical: Cervical adenopathy present.  Skin:    General: Skin is warm and dry.     Findings: Erythema (resolving urticaria) present.  Neurological:     General: No focal deficit present.     Mental Status: She is alert and oriented to person, place, and time.  Psychiatric:        Mood and Affect: Mood normal.        Behavior: Behavior normal.        Thought Content: Thought content normal.           Assessment & Plan:  Allergic rxn- new.  Reviewed ER notes.  Swelling improved dramatically after epinephrine making an allergic rxn more likely.  Needs allergy referral to determine triggers.  Will tx urticaria and mouth swelling w/ Prednisone taper.  Start magic  mouthwash for pain relief.  Pt prescribed epi pen to use if needed and instructed that if epi was administered, she needed to call 911.  Pt expressed understanding and is in agreement w/ plan.

## 2023-01-29 NOTE — Patient Instructions (Signed)
Follow up as needed or as scheduled START the Prednisone as directed- 3 pills at the same time x3 days, then 2 pills at the same time x3 days, then 1 pill daily.  Take w/ food  USE the magic mouthwash as directed/needed- swish and spit Continue daily antihistamine (Claritin/Zyrtec), and daily Famotidine (Pepcid) if additional hives Use Epipen if tongue begins to swell and call 911 Go to ER if symptoms change or worsen Call with any questions or concerns Hang in there!!!

## 2023-01-31 ENCOUNTER — Ambulatory Visit: Payer: 59 | Admitting: Family Medicine

## 2023-01-31 ENCOUNTER — Encounter (INDEPENDENT_AMBULATORY_CARE_PROVIDER_SITE_OTHER): Payer: 59 | Admitting: Family Medicine

## 2023-01-31 DIAGNOSIS — N390 Urinary tract infection, site not specified: Secondary | ICD-10-CM

## 2023-01-31 MED ORDER — CEPHALEXIN 500 MG PO CAPS: 500.0000 mg | ORAL_CAPSULE | Freq: Two times a day (BID) | ORAL | 0 refills | Status: AC

## 2023-01-31 NOTE — Telephone Encounter (Signed)
[facility] VISIT   Patient agreed to [facility] visit and is aware that copayment and coinsurance may apply. Patient was treated using telemedicine according to accepted telemedicine protocols.  Subjective:   Patient complains of bladder infection  Patient Active Problem List   Diagnosis Date Noted   Allergic contact dermatitis due to plants, except food 01/07/2022   Hypersensitivity reaction 01/07/2022   Melanocytic nevi of trunk 01/07/2022   Previous cesarean section 08/20/2018   Physical exam 04/24/2017   ADD (attention deficit disorder) 06/18/2016   Morbid obesity (HCC) 06/18/2016   Hypomanic bipolar II disorder (HCC)    Social History   Tobacco Use   Smoking status: Never   Smokeless tobacco: Never  Substance Use Topics   Alcohol use: Not Currently    Comment: wine, weekly    Current Outpatient Medications:    cephALEXin (KEFLEX) 500 MG capsule, Take 1 capsule (500 mg total) by mouth 2 (two) times daily for 10 doses., Disp: 10 capsule, Rfl: 0   magic mouthwash (lidocaine, diphenhydrAMINE, alum & mag hydroxide) suspension, Swish and spit 5 mLs 3 (three) times daily as needed for mouth pain., Disp: 200 mL, Rfl: 0   predniSONE (DELTASONE) 10 MG tablet, 3 tabs x3 days and then 2 tabs x3 days and then 1 tab x3 days.  Take w/ food., Disp: 18 tablet, Rfl: 0   Prenatal Vit-Fe Fumarate-FA (MULTIVITAMIN-PRENATAL) 27-0.8 MG TABS tablet, Take 1 tablet by mouth in the morning., Disp: , Rfl:   Allergies  Allergen Reactions   Feraheme [Ferumoxytol] Anaphylaxis    Chest pain during infusion   Dairy Aid [Tilactase] Nausea And Vomiting   Lamictal [Lamotrigine] Other (See Comments)    SJS (steven johnson syndrome)    Assessment and Plan:   Diagnosis: UTI. Please see myChart communication and orders below.   No orders of the defined types were placed in this encounter.  Meds ordered this encounter  Medications   cephALEXin (KEFLEX) 500 MG capsule    Sig: Take 1 capsule (500 mg total)  by mouth 2 (two) times daily for 10 doses.    Dispense:  10 capsule    Refill:  0    Neena Rhymes, MD 01/31/2023  A total of 6 minutes were spent by me to personally review the patient-generated inquiry, review patient records and data pertinent to assessment of the patient's problem, develop a management plan including generation of prescriptions and/or orders, and on subsequent communication with the patient through secure the MyChart portal service.   There is no separately reported E/M service related to this service in the past 7 days nor does the patient have an upcoming soonest available appointment for this issue. This work was completed in less than 7 days.   The patient consented to this service today (see patient agreement prior to ongoing communication). Patient counseled regarding the need for in-person exam for certain conditions and was advised to call the office if any changing or worsening symptoms occur.   The codes to be used for the E/M service are: [x]   99421 for 5-10 minutes of time spent on the inquiry. []   [id number] for 11-20 minutes. []   [id number] for 21+ minutes.

## 2023-02-18 ENCOUNTER — Ambulatory Visit: Payer: Self-pay | Admitting: Internal Medicine

## 2023-03-12 NOTE — Progress Notes (Unsigned)
New Patient Note  RE: Andrea [name] MRN: [id number] DOB: 1980-12-08 Date of Office Visit: 03/13/2023  Consult requested by: Sheliah Hatch, MD Primary care provider: Sheliah Hatch, MD  Chief Complaint: No chief complaint on file.  History of Present Illness: I had the pleasure of seeing Andrea [name] for initial evaluation at the [facility] on 03/12/2023. She is a 42 y.o. female, who is referred here by Sheliah Hatch, MD for the evaluation of allergic reaction.  01/29/2023 PCP visit: "Mouth swelling- swelling started Sunday afternoon w/ facial swelling and aphthous ulcers of lower lip.  Took antihistamines w/o relief.  Monday developed sores all over mouth.  Tuesday was unable to speak, eat or drink and tongue was 'so swollen it didn't fit in my mouth'- pt went to ER last night.  She was treated w/ epi, steroids, benadryl, and pepcid.  She was given an Epi pen prescription.  ER wanted to admit her but pt declined.  Pt was using a black cumin seed oil mouthwash that coincided w/ sxs.     Still having a hard time talking due to the pain and sores in mouth.  Son had HFM that had cleared by Andrea [name]."  01/28/2023 ER visit: "42 year old woman with past medical history of allergy, depression, and 5 months post partum presents with allergic reaction. Reports swelling and sores on her tongue over the past 48 hours. She had a broken filling in her tooth so she started black cumin seed oil using it as a swish and spit. She stopped using the product on Monday when symptoms started. She has a dentist appointment last week. No fevers or illnesses. This has happened before several years ago. She was told it was a stress reaction and a stevens johnson reaction due to Lamictal. She has been taking over-the-counter antihistamines with no improvement. States she has no difficulty breathing and is able to talk but has pain and swelling in the tongue. No dental disease. She does have  a rash with hives on her hands. She is breast feeding.   74 I spoke with the poison center to see if the black cumin seed oil could be contributory. It could be an allergic reaction. This does not sound like a specific toxidrome.  1932 I reassessed the patient. She now politely declines the CT scan. I explained my reasoning for the scan, however it is still declined. I informed her of the possible missed diagnoses and she verbalized understanding. She states the swelling is much better. She would like to be discharged. I requested she stay at least until the 3 hr mark since she received the epi. Will also give some ibuprofen for pain. I also discussed possible admission to the hospital for observation, however this was also politely declined. Will Po challenge.  2030 Patient was observed in the emergency permit for 3-1/2 hours with marked improvement in her symptoms. She states her tongue swelling has significantly improved and she would like to be discharged. She does still have pain over the sores but states no pain in her neck and no dental pain. At this time, do not believe she has a deep space infection but she understands this has not been definitively rule out without a CAT scan. Also discussed possibility of Stevens-Johnson. There is been no prodrome of illness preceding the symptoms. The timeline of the symptoms is directly when she started using that mouthwash containing the black seed oil. She is discontinued  the black seed oil. Given her marked improvement after epinephrine, suspect allergic reaction as cause of symptoms. She no longer has a rash. I did discuss that my recommendation is for admission to the hospital for close observation and additional workup. We had shared decision-making and patient politely declines admission. I will continue prescription for steroids for total of 5 days as well as prescribe an EpiPen. I reviewed strict return precautions including not limited to any new or  worsening symptoms as well as indications for the EpiPen. All questions answered to satisfaction. "  Assessment and Plan: Andrea [name] is a 42 y.o. female with: No problem-specific Assessment & Plan notes found for this encounter.  No follow-ups on file.  No orders of the defined types were placed in this encounter.  Lab Orders  No laboratory test(s) ordered today    Other allergy screening: Asthma: {Blank single:19197::"yes","no"} Rhino conjunctivitis: {Blank single:19197::"yes","no"} Food allergy: {Blank single:19197::"yes","no"} Medication allergy: {Blank single:19197::"yes","no"} Hymenoptera allergy: {Blank single:19197::"yes","no"} Urticaria: {Blank single:19197::"yes","no"} Eczema:{Blank single:19197::"yes","no"} History of recurrent infections suggestive of immunodeficency: {Blank single:19197::"yes","no"}  Diagnostics: Spirometry:  Tracings reviewed. Her effort: {Blank single:19197::"Good reproducible efforts.","It was hard to get consistent efforts and there is a question as to whether this reflects a maximal maneuver.","Poor effort, data can not be interpreted."} FVC: ***L FEV1: ***L, ***% predicted FEV1/FVC ratio: ***% Interpretation: {Blank single:19197::"Spirometry consistent with mild obstructive disease","Spirometry consistent with moderate obstructive disease","Spirometry consistent with severe obstructive disease","Spirometry consistent with possible restrictive disease","Spirometry consistent with mixed obstructive and restrictive disease","Spirometry uninterpretable due to technique","Spirometry consistent with normal pattern","No overt abnormalities noted given today's efforts"}.  Please see scanned spirometry results for details.  Skin Testing: {Blank single:19197::"Select foods","Environmental allergy panel","Environmental allergy panel and select foods","Food allergy panel","None","Deferred due to recent antihistamines use"}. *** Results discussed with  patient/family.   Past Medical History: Patient Active Problem List   Diagnosis Date Noted   Allergic contact dermatitis due to plants, except food 01/07/2022   Hypersensitivity reaction 01/07/2022   Melanocytic nevi of trunk 01/07/2022   Previous cesarean section 08/20/2018   Physical exam 04/24/2017   ADD (attention deficit disorder) 06/18/2016   Morbid obesity (HCC) 06/18/2016   Hypomanic bipolar II disorder (HCC)    Past Medical History:  Diagnosis Date   ADHD    Anemia    Hypomanic bipolar II disorder (HCC)    Past Surgical History: Past Surgical History:  Procedure Laterality Date   APPENDECTOMY     BREAST SURGERY     CESAREAN SECTION  10/2014   CESAREAN SECTION N/A 08/20/2018   Procedure: REPEAT CESAREAN SECTION;  Surgeon: Marcelle Overlie, MD;  Location: [facility] BIRTHING SU[facility];  Service: Obstetrics;  Laterality: N/A;  Repeat edc 08/27/18 allergy to lamictal Heather,  RNFA   CESAREAN SECTION  2023   CESAREAN SECTION  2022   CHOLECYSTECTOMY N/A 10/17/2022   Procedure: LAPAROSCOPIC CHOLECYSTECTOMY;  Surgeon: Harriette Bouillon, MD;  Location: [facility] OR;  Service: General;  Laterality: N/A;   LAPAROSCOPIC APPENDECTOMY N/A 07/15/2017   Procedure: APPENDECTOMY LAPAROSCOPIC;  Surgeon: Avel Peace, MD;  Location: [facility] ORS;  Service: General;  Laterality: N/A;   WISDOM TOOTH EXTRACTION     Medication List:  Current Outpatient Medications  Medication Sig Dispense Refill   magic mouthwash (lidocaine, diphenhydrAMINE, alum & mag hydroxide) suspension Swish and spit 5 mLs 3 (three) times daily as needed for mouth pain. 200 mL 0   predniSONE (DELTASONE) 10 MG tablet 3 tabs x3 days and then 2 tabs x3 days and then 1 tab x3 days.  Take w/ food. 18 tablet 0   Prenatal Vit-Fe Fumarate-FA (MULTIVITAMIN-PRENATAL) 27-0.8 MG TABS tablet Take 1 tablet by mouth in the morning.     No current facility-administered medications for this visit.   Allergies: Allergies  Allergen Reactions    Feraheme [Ferumoxytol] Anaphylaxis    Chest pain during infusion   Dairy Aid [Tilactase] Nausea And Vomiting   Lamictal [Lamotrigine] Other (See Comments)    SJS (steven johnson syndrome)   Social History: Social History   Socioeconomic History   Marital status: Married    Spouse name: Not on file   Number of children: Not on file   Years of education: Not on file   Highest education level: Master's degree (e.g., MA, MS, MEng, MEd, MSW, MBA)  Occupational History   Not on file  Tobacco Use   Smoking status: Never   Smokeless tobacco: Never  Vaping Use   Vaping Use: Never used  Substance and Sexual Activity   Alcohol use: Not Currently    Comment: wine, weekly   Drug use: No   Sexual activity: Yes    Birth control/protection: None  Other Topics Concern   Not on file  Social History Narrative   Not on file   Social Determinants of Health   Financial Resource Strain: Medium Risk (01/28/2023)   Overall Financial Resource Strain (CARDIA)    Difficulty of Paying Living Expenses: Somewhat hard  Food Insecurity: No Food Insecurity (01/28/2023)   Hunger Vital Sign    Worried About Running Out of Food in the Last Year: Never true    Ran Out of Food in the Last Year: Never true  Transportation Needs: No Transportation Needs (01/28/2023)   PRAPARE - Administrator, Civil Service (Medical): No    Lack of Transportation (Non-Medical): No  Physical Activity: Insufficiently Active (01/28/2023)   Exercise Vital Sign    Days of Exercise per Week: 1 day    Minutes of Exercise per Session: 20 min  Stress: Stress Concern Present (01/28/2023)   [employer] of Occupational Health - Occupational Stress Questionnaire    Feeling of Stress : Very much  Social Connections: Moderately Isolated (01/28/2023)   Social Connection and Isolation Panel [NHANES]    Frequency of Communication with Friends and Family: Once a week    Frequency of Social Gatherings with Friends and Family:  Once a week    Attends Religious Services: 1 to 4 times per year    Active Member of [employer] or Organizations: No    Attends Engineer, structural: Not on file    Marital Status: Married   Lives in a ***. Smoking: *** Occupation: ***  Environmental HistorySurveyor, minerals in the house: Copywriter, advertising in the family room: {Blank single:19197::"yes","no"} Carpet in the bedroom: {Blank single:19197::"yes","no"} Heating: {Blank single:19197::"electric","gas","heat pump"} Cooling: {Blank single:19197::"central","window","heat pump"} Pet: {Blank single:19197::"yes ***","no"}  Family History: Family History  Problem Relation Age of Onset   Healthy Mother    Healthy Father    Cancer Maternal Grandmother        Uterine Cancer   Heart disease Maternal Grandfather    Problem                               Relation Asthma                                   ***  Eczema                                *** Food allergy                          *** Allergic rhino conjunctivitis     ***  Review of Systems  Constitutional:  Negative for appetite change, chills, fever and unexpected weight change.  HENT:  Negative for congestion and rhinorrhea.   Eyes:  Negative for itching.  Respiratory:  Negative for cough, chest tightness, shortness of breath and wheezing.   Cardiovascular:  Negative for chest pain.  Gastrointestinal:  Negative for abdominal pain.  Genitourinary:  Negative for difficulty urinating.  Skin:  Negative for rash.  Neurological:  Negative for headaches.    Objective: There were no vitals taken for this visit. There is no height or weight on file to calculate BMI. Physical Exam Vitals and nursing note reviewed.  Constitutional:      Appearance: Normal appearance. She is well-developed.  HENT:     Head: Normocephalic and atraumatic.     Right Ear: Tympanic membrane and external ear normal.     Left Ear: Tympanic membrane and external ear  normal.     Nose: Nose normal.     Mouth/Throat:     Mouth: Mucous membranes are moist.     Pharynx: Oropharynx is clear.  Eyes:     Conjunctiva/sclera: Conjunctivae normal.  Cardiovascular:     Rate and Rhythm: Normal rate and regular rhythm.     Heart sounds: Normal heart sounds. No murmur heard.    No friction rub. No gallop.  Pulmonary:     Effort: Pulmonary effort is normal.     Breath sounds: Normal breath sounds. No wheezing, rhonchi or rales.  Musculoskeletal:     Cervical back: Neck supple.  Skin:    General: Skin is warm.     Findings: No rash.  Neurological:     Mental Status: She is alert and oriented to person, place, and time.  Psychiatric:        Behavior: Behavior normal.    The plan was reviewed with the patient/family, and all questions/concerned were addressed.  It was my pleasure to see Mannie today and participate in her care. Please feel free to contact me with any questions or concerns.  Sincerely,  Wyline Mood, DO Allergy & Immunology  [facility] office: [phone] [facility] office: [phone]

## 2023-03-13 ENCOUNTER — Ambulatory Visit: Payer: 59 | Admitting: Allergy

## 2023-03-13 ENCOUNTER — Encounter: Payer: Self-pay | Admitting: Allergy

## 2023-03-13 ENCOUNTER — Other Ambulatory Visit: Payer: Self-pay

## 2023-03-13 VITALS — BP 104/66 | HR 72 | Temp 98.2°F | Resp 20 | Ht 64.5 in | Wt 218.8 lb

## 2023-03-13 DIAGNOSIS — T63481A Toxic effect of venom of other arthropod, accidental (unintentional), initial encounter: Secondary | ICD-10-CM | POA: Diagnosis not present

## 2023-03-13 DIAGNOSIS — T7840XA Allergy, unspecified, initial encounter: Secondary | ICD-10-CM | POA: Diagnosis not present

## 2023-03-13 DIAGNOSIS — L509 Urticaria, unspecified: Secondary | ICD-10-CM | POA: Diagnosis not present

## 2023-03-13 DIAGNOSIS — Z872 Personal history of diseases of the skin and subcutaneous tissue: Secondary | ICD-10-CM

## 2023-03-13 DIAGNOSIS — T783XXD Angioneurotic edema, subsequent encounter: Secondary | ICD-10-CM

## 2023-03-13 DIAGNOSIS — T7840XD Allergy, unspecified, subsequent encounter: Secondary | ICD-10-CM

## 2023-03-13 NOTE — Assessment & Plan Note (Signed)
Large localized reaction requiring steroids at times. Continue to avoid. Get bloodwork.

## 2023-03-13 NOTE — Assessment & Plan Note (Signed)
Allergic reaction in May with oral ulcers, facial swelling and hives - requiring ER visit with epi, steroids, benadryl, famotidine. Question is black cumin seed oil was a trigger as that was new. History of hive outbreaks as well. No family history of angioedema. No taking ace-inh. Avoid cumin for now. Keep track of episodes and take pictures. Avoid the following potential triggers: alcohol, tight clothing, NSAIDs, hot showers and getting overheated. See below for proper skin care.  For mild symptoms you can take over the counter antihistamines such as Benadryl 1-2 tablets = 25-50mg  and monitor symptoms closely. If symptoms worsen or if you have severe symptoms including breathing issues, throat closure, significant swelling, whole body hives, severe diarrhea and vomiting, lightheadedness then inject epinephrine and seek immediate medical care afterwards. Emergency action plan given. Get bloodwork.

## 2023-03-13 NOTE — Patient Instructions (Addendum)
Hives/angioedema/allergic reaction: Avoid cumin for now. Keep track of episodes and take pictures. Avoid the following potential triggers: alcohol, tight clothing, NSAIDs, hot showers and getting overheated. See below for proper skin care.   For mild symptoms you can take over the counter antihistamines such as Benadryl 1-2 tablets = 25-50mg  and monitor symptoms closely. If symptoms worsen or if you have severe symptoms including breathing issues, throat closure, significant swelling, whole body hives, severe diarrhea and vomiting, lightheadedness then inject epinephrine and seek immediate medical care afterwards. Emergency action plan given.  Get bloodwork:  We are ordering labs, so please allow 1-2 weeks for the results to come back. With the newly implemented Cures Act, the labs might be visible to you at the same time that they become visible to me. However, I will not address the results until all of the results are back, so please be patient.  If you have another episode:  Start zyrtec (cetirizine) 10mg  twice a day. If symptoms are not controlled or causes drowsiness let us know. Start pepcid (famotidine) 20mg  twice a day.   Bee stings Continue to avoid. Get bloodwork.  SJS Avoid lamictal  Follow up in 4 months or sooner if needed. Follow up with your hematologist as scheduled.   Skin care recommendations  Bath time: Always use lukewarm water. AVOID very hot or cold water. Keep bathing time to 5-10 minutes. Do NOT use bubble bath. Use a mild soap and use just enough to wash the dirty areas. Do NOT scrub skin vigorously.  After bathing, pat dry your skin with a towel. Do NOT rub or scrub the skin.  Moisturizers and prescriptions:  ALWAYS apply moisturizers immediately after bathing (within 3 minutes). This helps to lock-in moisture. Use the moisturizer several times a day over the whole body. Good summer moisturizers include: Aveeno, CeraVe, Cetaphil. Good winter  moisturizers include: Aquaphor, Vaseline, Cerave, Cetaphil, Eucerin, Vanicream. When using moisturizers along with medications, the moisturizer should be applied about one hour after applying the medication to prevent diluting effect of the medication or moisturize around where you applied the medications. When not using medications, the moisturizer can be continued twice daily as maintenance.  Laundry and clothing: Avoid laundry products with added color or perfumes. Use unscented hypo-allergenic laundry products such as Tide free, Cheer free & gentle, and All free and clear.  If the skin still seems dry or sensitive, you can try double-rinsing the clothes. Avoid tight or scratchy clothing such as wool. Do not use fabric softeners or dyer sheets.

## 2023-03-13 NOTE — Assessment & Plan Note (Signed)
Reaction in 2013 to Lamictal requiring hospitalization with acute care.  Avoid Lamictal for lifetime.

## 2023-03-13 NOTE — Assessment & Plan Note (Signed)
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 Usually occurs every 2 months or so with no known triggers. Currently breastfeeding and declines antihistamines.  Hives/angioedema/allergic reaction: Keep track of episodes and take pictures. Avoid the following potential triggers: alcohol, tight clothing, NSAIDs, hot showers and getting overheated. See below for proper skin care.  Get bloodwork:  If you have another episode:  Start zyrtec (cetirizine) 10mg  twice a day. If symptoms are not controlled or causes drowsiness let us know. Start Pepcid (famotidine) 20mg  twice a day.

## 2023-04-25 ENCOUNTER — Telehealth: Payer: Self-pay | Admitting: Family Medicine

## 2023-05-01 ENCOUNTER — Ambulatory Visit (INDEPENDENT_AMBULATORY_CARE_PROVIDER_SITE_OTHER): Payer: 59 | Admitting: Family Medicine

## 2023-05-01 ENCOUNTER — Encounter: Payer: Self-pay | Admitting: Family Medicine

## 2023-05-01 VITALS — BP 128/74 | HR 81 | Temp 97.9°F | Resp 18 | Ht 64.5 in | Wt 227.1 lb

## 2023-05-01 DIAGNOSIS — L918 Other hypertrophic disorders of the skin: Secondary | ICD-10-CM

## 2023-05-01 NOTE — Progress Notes (Signed)
   Subjective:    Patient ID: Andrea [name], female    DOB: October 14, 1980, 42 y.o.   MRN: [id number]  HPI Skin tag- R side of neck.  Became much more prominent during pregnancy.  Is not able to wear necklaces bc it pulls on the area and causes irritation.  Children will pull on the area when she is holding them.  She wants tag removed today.   Review of Systems For ROS see HPI     Objective:   Physical Exam Vitals reviewed.  Constitutional:      General: She is not in acute distress.    Appearance: Normal appearance. She is not ill-appearing.  Skin:    General: Skin is warm and dry.     Findings: Lesion (1 cm skin tag on R lower lateral neck just above clavicle) present.  Neurological:     General: No focal deficit present.     Mental Status: She is alert and oriented to person, place, and time.  Psychiatric:        Mood and Affect: Mood normal.        Behavior: Behavior normal.        Thought Content: Thought content normal.           Assessment & Plan:  Skin tag- pt here today for removal.  After discussion of options, she elected for cold spray over lidocaine injections.  Pt consented to removal of tag and declined pathology.  Area was cleaned w/ betadine swab then alcohol pad.  Cold spray was applied and tag was removed using forceps and suture scissors.  Minimal bleeding.  Pressure was held w/ gauze and then band aids applied.  Pt tolerated w/o difficulty and she was happy with the result.  Wound care instructions reviewed.  Reviewed supportive care and red flags that should prompt return.  Pt expressed understanding and is in agreement w/ plan.

## 2023-05-01 NOTE — Patient Instructions (Signed)
Follow up as needed or as scheduled Keep the area clean and pat dry A little bit of bacitracin or antibiotic ointment to the area once the bleeding stops Keep pressure on it for the next few hours Keep covered until the area has scabbed over If there is any discolored drainage, surrounding redness, or other concern for infection- let me know Call with any questions or concerns Hang in there!  You're doing great!!

## 2023-07-01 ENCOUNTER — Ambulatory Visit: Payer: 59 | Admitting: Allergy

## 2023-09-12 NOTE — Telephone Encounter (Signed)
error 

## 2023-10-22 ENCOUNTER — Telehealth: Payer: Self-pay

## 2023-10-22 MED ORDER — OSELTAMIVIR PHOSPHATE 75 MG PO CAPS: 75.0000 mg | ORAL_CAPSULE | Freq: Two times a day (BID) | ORAL | 0 refills | Status: DC

## 2023-10-22 NOTE — Telephone Encounter (Signed)
 Ok to send Tamiflu  75mg - 1 tab daily x10 days.  #10, no refills

## 2023-10-22 NOTE — Telephone Encounter (Signed)
 Sent Tamiflu  with directions per Dr Marietta Shorter previous message. Called patient let her know and she was appreciated

## 2023-10-22 NOTE — Telephone Encounter (Signed)
 Patient is requesting prophylactic Tamiflu  please advise

## 2023-10-22 NOTE — Addendum Note (Signed)
 Addended by: Ilah Boule K on: 10/22/2023 03:48 PM   Modules accepted: Orders

## 2023-10-22 NOTE — Telephone Encounter (Signed)
 Copied from CRM [id number]. Topic: Clinical - Medication Question >> Oct 22, 2023  2:00 PM Adaysia C wrote: Reason for CRM: Patient has been exposed to Influenza A because majority of the people in her household have been diagnosed with it. The patient is requesting to be prescribed a preventative pill so she does not contract it. Please follow up with patient [phone]

## 2024-01-29 ENCOUNTER — Telehealth: Payer: Self-pay

## 2024-01-29 DIAGNOSIS — Z0000 Encounter for general adult medical examination without abnormal findings: Secondary | ICD-10-CM

## 2024-01-29 NOTE — Telephone Encounter (Signed)
 Ok for pt to schedule lab visit prior to CPE appt  CBC w/ diff CMP Lipid panel TSH A1C Vit D  Dx- Obesity

## 2024-01-29 NOTE — Telephone Encounter (Signed)
 Called and scheduled for lab only appt and ordered labs for future

## 2024-01-29 NOTE — Telephone Encounter (Signed)
 Copied from CRM [id number]. Topic: Clinical - Request for Lab/Test Order >> Jan 29, 2024 10:04 AM Magdalene School wrote: Reason for CRM: Patient calling to request lab order to do the blood work prior to her physical which is scheduled for 03/08/24. She would like call back to let her if it's possible.

## 2024-01-29 NOTE — Telephone Encounter (Signed)
 Patient is wondering if she can complete blood work prior to her physical on 03/08/2024?

## 2024-02-20 ENCOUNTER — Other Ambulatory Visit

## 2024-02-20 DIAGNOSIS — Z0000 Encounter for general adult medical examination without abnormal findings: Secondary | ICD-10-CM

## 2024-02-20 DIAGNOSIS — Z13220 Encounter for screening for lipoid disorders: Secondary | ICD-10-CM

## 2024-02-20 LAB — LIPID PANEL
Cholesterol: 109 mg/dL (ref 0–200)
HDL: 40.7 mg/dL (ref >39.00)
LDL Cholesterol: 52 mg/dL (ref 0–99)
NonHDL: 68.2
Total CHOL/HDL Ratio: 3
Triglycerides: 79 mg/dL (ref 0.0–149.0)
VLDL: 15.8 mg/dL (ref 0.0–40.0)

## 2024-02-20 LAB — COMPREHENSIVE METABOLIC PANEL WITH GFR
ALT: 24 U/L (ref 0–35)
AST: 18 U/L (ref 0–37)
Albumin: 4.5 g/dL (ref 3.5–5.2)
Alkaline Phosphatase: 59 U/L (ref 39–117)
BUN: 10 mg/dL (ref 6–23)
CO2: 25 meq/L (ref 19–32)
Calcium: 8.8 mg/dL (ref 8.4–10.5)
Chloride: 104 meq/L (ref 96–112)
Creatinine, Ser: 0.59 mg/dL (ref 0.40–1.20)
GFR: 111.48 mL/min (ref >60.00)
Glucose, Bld: 85 mg/dL (ref 70–99)
Potassium: 4.3 meq/L (ref 3.5–5.1)
Sodium: 135 meq/L (ref 135–145)
Total Bilirubin: 0.7 mg/dL (ref 0.2–1.2)
Total Protein: 7.3 g/dL (ref 6.0–8.3)

## 2024-02-20 LAB — CBC WITH DIFFERENTIAL/PLATELET
Basophils Absolute: 0 10*3/uL (ref 0.0–0.1)
Basophils Relative: 1.1 % (ref 0.0–3.0)
Eosinophils Absolute: 0.1 10*3/uL (ref 0.0–0.7)
Eosinophils Relative: 1.9 % (ref 0.0–5.0)
HCT: 35.9 % — ABNORMAL LOW (ref 36.0–46.0)
Hemoglobin: 12.1 g/dL (ref 12.0–15.0)
Lymphocytes Relative: 51.1 % — ABNORMAL HIGH (ref 12.0–46.0)
Lymphs Abs: 2 10*3/uL (ref 0.7–4.0)
MCHC: 33.5 g/dL (ref 30.0–36.0)
MCV: 92.3 fl (ref 78.0–100.0)
Monocytes Absolute: 0.2 10*3/uL (ref 0.1–1.0)
Monocytes Relative: 5.8 % (ref 3.0–12.0)
Neutro Abs: 1.5 10*3/uL (ref 1.4–7.7)
Neutrophils Relative %: 40.1 % — ABNORMAL LOW (ref 43.0–77.0)
Platelets: 306 10*3/uL (ref 150.0–400.0)
RBC: 3.89 Mil/uL (ref 3.87–5.11)
RDW: 12.8 % (ref 11.5–15.5)
WBC: 3.8 10*3/uL — ABNORMAL LOW (ref 4.0–10.5)

## 2024-02-20 LAB — HEMOGLOBIN A1C: Hgb A1c MFr Bld: 4.6 % (ref 4.6–6.5)

## 2024-02-26 ENCOUNTER — Ambulatory Visit: Payer: Self-pay | Admitting: Family Medicine

## 2024-02-26 LAB — TSH: TSH: 1.54 u[IU]/mL (ref 0.35–5.50)

## 2024-02-26 LAB — VITAMIN D 25 HYDROXY (VIT D DEFICIENCY, FRACTURES): VITD: 15.48 ng/mL — ABNORMAL LOW (ref 30.00–100.00)

## 2024-02-26 MED ORDER — VITAMIN D (ERGOCALCIFEROL) 1.25 MG (50000 UNIT) PO CAPS: 50000.0000 [IU] | ORAL_CAPSULE | ORAL | 0 refills | Status: AC

## 2024-03-08 ENCOUNTER — Ambulatory Visit (INDEPENDENT_AMBULATORY_CARE_PROVIDER_SITE_OTHER): Admitting: Family Medicine

## 2024-03-08 ENCOUNTER — Encounter: Payer: Self-pay | Admitting: Family Medicine

## 2024-03-08 VITALS — BP 118/64 | HR 58 | Resp 20 | Ht 64.5 in | Wt 234.0 lb

## 2024-03-08 DIAGNOSIS — Z0000 Encounter for general adult medical examination without abnormal findings: Secondary | ICD-10-CM

## 2024-03-08 DIAGNOSIS — F902 Attention-deficit hyperactivity disorder, combined type: Secondary | ICD-10-CM

## 2024-03-08 MED ORDER — AMPHETAMINE-DEXTROAMPHET ER 10 MG PO CP24: 10.0000 mg | ORAL_CAPSULE | Freq: Every day | ORAL | 0 refills | Status: DC

## 2024-03-08 NOTE — Assessment & Plan Note (Signed)
 Deteriorated.  Pt has been off medication x5 yrs as she has had multiple children.  She admits that she is easily distracted, will veer off on tangents easily, tends to have worst case scenario thinking.  Will restart Adderall at a low dose and titrate prn.  Pt expressed understanding and is in agreement w/ plan.

## 2024-03-08 NOTE — Progress Notes (Signed)
   Subjective:    Patient ID: Andrea [name], female    DOB: 1981-04-03, 43 y.o.   MRN: [id number]  HPI CPE- UTD on Tdap.  GYN- Dr Glade Door, UTD on pap  Patient Care Team    Relationship Specialty Notifications Start End  Mahlon Comer BRAVO, MD PCP - General Family Medicine  06/18/16     Health Maintenance  Topic Date Due   HPV VACCINES (1 - Risk 3-dose SCDM series) Never done   Cervical Cancer Screening (HPV/Pap Cotest)  03/08/2023   INFLUENZA VACCINE  04/16/2024   DTaP/Tdap/Td (3 - Td or Tdap) 04/25/2027   HIV Screening  Completed   Meningococcal B Vaccine  Aged Out   COVID-19 Vaccine  Discontinued   Hepatitis C Screening  Discontinued      Review of Systems Patient reports no vision/ hearing changes, adenopathy,fever, weight change,  persistant/recurrent hoarseness , swallowing issues, chest pain, palpitations, edema, persistant/recurrent cough, hemoptysis, dyspnea (rest/exertional/paroxysmal nocturnal), gastrointestinal bleeding (melena, rectal bleeding), abdominal pain, significant heartburn, bowel changes, GU symptoms (dysuria, hematuria, incontinence), Gyn symptoms (abnormal  bleeding, pain),  syncope, focal weakness, memory loss, numbness & tingling, skin/hair/nail changes, abnormal bruising or bleeding  + anxiety- pt reports she will jump to worst case scenario thinking.  Denies sadness or anger.  Is frequent spiraling.  Easily distracted.    Objective:   Physical Exam General Appearance:    Alert, cooperative, no distress, appears stated age, obese  Head:    Normocephalic, without obvious abnormality, atraumatic  Eyes:    PERRL, conjunctiva/corneas clear, EOM's intact both eyes  Ears:    Normal TM's and external ear canals, both ears  Nose:   Nares normal, septum midline, mucosa normal, no drainage    or sinus tenderness  Throat:   Lips, mucosa, and tongue normal; teeth and gums normal  Neck:   Supple, symmetrical, trachea midline, no adenopathy;    Thyroid : no  enlargement/tenderness/nodules  Back:     Symmetric, no curvature, ROM normal, no CVA tenderness  Lungs:     Clear to auscultation bilaterally, respirations unlabored  Chest Wall:    No tenderness or deformity   Heart:    Regular rate and rhythm, S1 and S2 normal, no murmur, rub   or gallop  Breast Exam:    Deferred to GYN  Abdomen:     Soft, non-tender, bowel sounds active all four quadrants,    no masses, no organomegaly  Genitalia:    Deferred to GYN  Rectal:    Extremities:   Extremities normal, atraumatic, no cyanosis or edema  Pulses:   2+ and symmetric all extremities  Skin:   Skin color, texture, turgor normal, no rashes or lesions  Lymph nodes:   Cervical, supraclavicular, and axillary nodes normal  Neurologic:   CNII-XII intact, normal strength, sensation and reflexes    throughout          Assessment & Plan:

## 2024-03-08 NOTE — Assessment & Plan Note (Signed)
 Pt's PE WNL w/ exception of BMI.  UTD on pap, Tdap.  Reviewed labs from earlier this month- no need to repeat.  Anticipatory guidance provided.

## 2024-03-08 NOTE — Patient Instructions (Signed)
 Follow up in 6 weeks to recheck ADHD/anxiety No need to repeat labs today- yay!!! START the Adderall daily Continue to work on healthy diet and regular exercise- you can do it! Call with any questions or concerns Stay Safe!  Stay Healthy! Hang in there!!!

## 2024-04-19 ENCOUNTER — Ambulatory Visit (INDEPENDENT_AMBULATORY_CARE_PROVIDER_SITE_OTHER)

## 2024-04-19 ENCOUNTER — Ambulatory Visit: Admitting: Family Medicine

## 2024-04-19 ENCOUNTER — Encounter: Payer: Self-pay | Admitting: Family Medicine

## 2024-04-19 ENCOUNTER — Ambulatory Visit (INDEPENDENT_AMBULATORY_CARE_PROVIDER_SITE_OTHER): Admitting: Family Medicine

## 2024-04-19 VITALS — BP 128/78 | HR 96 | Temp 98.3°F | Ht 64.5 in

## 2024-04-19 DIAGNOSIS — H9201 Otalgia, right ear: Secondary | ICD-10-CM | POA: Diagnosis not present

## 2024-04-19 DIAGNOSIS — J329 Chronic sinusitis, unspecified: Secondary | ICD-10-CM | POA: Diagnosis not present

## 2024-04-19 DIAGNOSIS — W108XXA Fall (on) (from) other stairs and steps, initial encounter: Secondary | ICD-10-CM | POA: Diagnosis not present

## 2024-04-19 DIAGNOSIS — R0981 Nasal congestion: Secondary | ICD-10-CM | POA: Diagnosis not present

## 2024-04-19 DIAGNOSIS — M25532 Pain in left wrist: Secondary | ICD-10-CM

## 2024-04-19 DIAGNOSIS — B9689 Other specified bacterial agents as the cause of diseases classified elsewhere: Secondary | ICD-10-CM

## 2024-04-19 LAB — POC COVID19 BINAXNOW: SARS Coronavirus 2 Ag: NEGATIVE

## 2024-04-19 MED ORDER — AMOXICILLIN 875 MG PO TABS: 875.0000 mg | ORAL_TABLET | Freq: Two times a day (BID) | ORAL | 0 refills | Status: AC

## 2024-04-19 MED ORDER — AMPHETAMINE-DEXTROAMPHET ER 10 MG PO CP24: 10.0000 mg | ORAL_CAPSULE | Freq: Every day | ORAL | 0 refills | Status: AC

## 2024-04-19 NOTE — Progress Notes (Unsigned)
   Subjective:    Patient ID: Andrea [name], female    DOB: [birth year], 43 y.o.   MRN: [id number]  HPI Ear pain- sxs started Friday w/ mild HA, congestion.  Woke Saturday w/ head fullness, low grade temp, R ear pain.  Fever improved w/ Motrin .  Saturday night developed productive cough.  R ear pain is waking her from sleep.  + maxillary pain.  Fall- occurred 2-3 weeks ago.  Slipped down the stairs- caught herself on the tile floor.  Continues to have L wrist pain.  Has been using ice and blue emu oil.  Wrist is mildly swollen.   Review of Systems For ROS see HPI     Objective:   Physical Exam Vitals reviewed.  Constitutional:      General: She is not in acute distress.    Appearance: She is well-developed.  HENT:     Head: Normocephalic and atraumatic.     Right Ear: Tympanic membrane is erythematous and retracted.     Left Ear: Tympanic membrane normal.     Nose: Mucosal edema and congestion present. No rhinorrhea.     Right Sinus: Maxillary sinus tenderness and frontal sinus tenderness present.     Left Sinus: No maxillary sinus tenderness or frontal sinus tenderness.     Mouth/Throat:     Pharynx: Uvula midline. Posterior oropharyngeal erythema present. No oropharyngeal exudate.  Eyes:     Conjunctiva/sclera: Conjunctivae normal.     Pupils: Pupils are equal, round, and reactive to light.  Cardiovascular:     Rate and Rhythm: Normal rate and regular rhythm.     Heart sounds: Normal heart sounds.  Pulmonary:     Effort: Pulmonary effort is normal. No respiratory distress.     Breath sounds: Normal breath sounds. No wheezing.  Musculoskeletal:        General: Swelling (over L distal ulna w/ TTP) present.     Cervical back: Normal range of motion and neck supple.  Lymphadenopathy:     Cervical: No cervical adenopathy.  Skin:    General: Skin is warm and dry.  Neurological:     General: No focal deficit present.     Mental Status: She is alert and oriented to person, place,  and time.     Cranial Nerves: No cranial nerve deficit.     Motor: No weakness.     Coordination: Coordination normal.  Psychiatric:        Mood and Affect: Mood normal.        Behavior: Behavior normal.        Thought Content: Thought content normal.           Assessment & Plan:  Bacterial sinusitis- new.  This accounts for pt's congestion and ear pain.  Very TTP over R maxillary sinus.  Start Amoxicillin  BID.  Reviewed supportive care and red flags that should prompt return.  Pt expressed understanding and is in agreement w/ plan.   Fall on stairs w/ L wrist pain- new.  Pt fell ~3 weeks ago but caught herself w/ her L hand on tile floor.  She continues to have swelling and pain.  Will get xrays to assess.  Pt expressed understanding and is in agreement w/ plan.

## 2024-04-19 NOTE — Patient Instructions (Signed)
 Follow up as needed or as scheduled Go to [facility] to get your xray done START the Amoxicillin  twice daily for the sinus infection- take w/ food Drink LOTS of fluids ICE your wrist for pain and swelling Call with any questions or concerns Hang in there!!!

## 2024-04-20 ENCOUNTER — Encounter: Payer: Self-pay | Admitting: Family Medicine

## 2024-04-20 ENCOUNTER — Ambulatory Visit: Payer: Self-pay | Admitting: Family Medicine

## 2024-05-04 ENCOUNTER — Ambulatory Visit: Payer: Self-pay

## 2024-05-04 NOTE — Telephone Encounter (Signed)
 FYI Only or Action Required?: FYI only for provider.  Patient was last seen in primary care on 04/19/2024 by Mahlon Comer BRAVO, MD.  Called Nurse Triage reporting Breast Pain.  Symptoms began several days ago.  Interventions attempted: Nothing.  Symptoms are: unchanged.  Triage Disposition: See PCP When Office is Open (Within 3 Days)  Patient/caregiver understands and will follow disposition?: Yes     Copied from CRM #[id number]. Topic: Clinical - Red Word Triage >> May 04, 2024  8:53 AM Viola F wrote: Red Word that prompted transfer to Nurse Triage: Patient having pain in left breast around nipple area for the past few days Reason for Disposition  Change in shape or appearance of breast  Answer Assessment - Initial Assessment Questions 1. SYMPTOM: What's the main symptom you're concerned about?  (e.g., lump, nipple discharge, pain, rash)     Breast pain 2. LOCATION: Where is the pain located?     Left breast below and around the nipple 3. ONSET: When did  pain start?     3 days 4. PRIOR HISTORY: Do you have any history of prior problems with your breasts? (e.g., breast cancer, breast implant, fibrocystic breast disease)     No but does has implants 5. CAUSE: What do you think is causing this symptom?     unknown 6. OTHER SYMPTOMS: Do you have any other symptoms? (e.g., breast pain, fever, nipple discharge, redness or rash)     Feels different from other breat, also random stinging pain, slight swelling on left bottom side 7. PREGNANCY-BREASTFEEDING: Is there any chance you are pregnant? When was your last menstrual period? Are you breastfeeding?     Stopped breast feeding in Feb 2025  Protocols used: Breast Symptoms-A-AH

## 2024-05-05 ENCOUNTER — Encounter: Payer: Self-pay | Admitting: Family Medicine

## 2024-05-05 ENCOUNTER — Ambulatory Visit (INDEPENDENT_AMBULATORY_CARE_PROVIDER_SITE_OTHER): Admitting: Family Medicine

## 2024-05-05 ENCOUNTER — Other Ambulatory Visit: Payer: Self-pay | Admitting: Family Medicine

## 2024-05-05 VITALS — BP 112/62 | HR 67 | Temp 98.0°F | Resp 16 | Ht 64.5 in | Wt 231.4 lb

## 2024-05-05 DIAGNOSIS — N644 Mastodynia: Secondary | ICD-10-CM

## 2024-05-05 NOTE — Progress Notes (Signed)
   Subjective:    Patient ID: Andrea [name], female    DOB: 1980-12-31, 43 y.o.   MRN: [id number]  HPI L breast pain- sxs started 1 week ago.  Breast was initially painful when husband hugged her.  Any type of pressure- including wearing a bra- has caused tenderness.  Then developed 'cinching pain' that was shooting from under L nipple up towards armpit.  Has never had mammo due to pregnancy and breast feeding.  Denies discrete lump but breast feels different than R side.  Some improvement w/ ibuprofen .  Does have implants   Review of Systems For ROS see HPI     Objective:   Physical Exam Vitals reviewed.  Constitutional:      General: She is not in acute distress.    Appearance: Normal appearance. She is not ill-appearing.  HENT:     Head: Normocephalic and atraumatic.  Chest:  Breasts:    Right: Normal.     Left: Tenderness present. No swelling, bleeding, inverted nipple, mass, nipple discharge or skin change.    Lymphadenopathy:     Upper Body:     Left upper body: No supraclavicular, axillary or pectoral adenopathy.  Skin:    General: Skin is warm and dry.  Neurological:     General: No focal deficit present.     Mental Status: She is alert and oriented to person, place, and time.  Psychiatric:        Mood and Affect: Mood normal.        Behavior: Behavior normal.        Thought Content: Thought content normal.           Assessment & Plan:  L breast pain- new.  Pt reports breast TTP and also 'cinching' pain that will send shooting pains through her lateral L breast.  No masses or skin changes.  Does have hx of implants.  Will get diagnostic mammo to assess.  If mammo WNL, will need referral to breast surgeon for possible implant removal.  Pt expressed understanding and is in agreement w/ plan.

## 2024-05-05 NOTE — Patient Instructions (Signed)
 Follow up as needed or as scheduled We'll call [facility] Continue Ibuprofen  for pain relief Try and support the breasts as much as possible to avoid gravitational pull Call with any questions or concerns Stay Safe!  Stay Healthy! Hang in there!

## 2024-05-11 ENCOUNTER — Ambulatory Visit
Admission: RE | Admit: 2024-05-11 | Discharge: 2024-05-11 | Disposition: A | Discharge status: Home / Self Care | Source: Ambulatory Visit | Attending: Family Medicine | Admitting: Family Medicine

## 2024-05-11 ENCOUNTER — Other Ambulatory Visit: Payer: Self-pay | Admitting: Family Medicine

## 2024-05-11 DIAGNOSIS — N644 Mastodynia: Secondary | ICD-10-CM

## 2024-06-10 ENCOUNTER — Encounter

## 2024-06-10 DIAGNOSIS — Z1231 Encounter for screening mammogram for malignant neoplasm of breast: Secondary | ICD-10-CM
